# Patient Record
Sex: Female | Born: 1960 | Race: Black or African American | Hispanic: No | Smoking: Current some day smoker
Health system: Southern US, Community
[De-identification: ages and names within clinical notes are randomized; demographics above are authoritative.]

## PROBLEM LIST (undated history)

## (undated) DIAGNOSIS — Z8739 Personal history of other diseases of the musculoskeletal system and connective tissue: Secondary | ICD-10-CM

## (undated) DIAGNOSIS — E039 Hypothyroidism, unspecified: Secondary | ICD-10-CM

## (undated) DIAGNOSIS — N200 Calculus of kidney: Secondary | ICD-10-CM

## (undated) DIAGNOSIS — F329 Major depressive disorder, single episode, unspecified: Secondary | ICD-10-CM

## (undated) DIAGNOSIS — I499 Cardiac arrhythmia, unspecified: Secondary | ICD-10-CM

## (undated) DIAGNOSIS — R011 Cardiac murmur, unspecified: Secondary | ICD-10-CM

## (undated) DIAGNOSIS — J189 Pneumonia, unspecified organism: Secondary | ICD-10-CM

## (undated) DIAGNOSIS — R000 Tachycardia, unspecified: Secondary | ICD-10-CM

## (undated) DIAGNOSIS — R51 Headache: Secondary | ICD-10-CM

## (undated) DIAGNOSIS — G629 Polyneuropathy, unspecified: Secondary | ICD-10-CM

## (undated) DIAGNOSIS — G43909 Migraine, unspecified, not intractable, without status migrainosus: Secondary | ICD-10-CM

## (undated) DIAGNOSIS — E669 Obesity, unspecified: Secondary | ICD-10-CM

## (undated) DIAGNOSIS — I10 Essential (primary) hypertension: Secondary | ICD-10-CM

## (undated) DIAGNOSIS — O159 Eclampsia, unspecified as to time period: Secondary | ICD-10-CM

## (undated) DIAGNOSIS — F32A Depression, unspecified: Secondary | ICD-10-CM

## (undated) DIAGNOSIS — R519 Headache, unspecified: Secondary | ICD-10-CM

## (undated) DIAGNOSIS — T7840XA Allergy, unspecified, initial encounter: Secondary | ICD-10-CM

## (undated) DIAGNOSIS — L732 Hidradenitis suppurativa: Secondary | ICD-10-CM

## (undated) HISTORY — PX: TUBAL LIGATION: SHX77

## (undated) HISTORY — DX: Allergy, unspecified, initial encounter: T78.40XA

## (undated) HISTORY — DX: Hidradenitis suppurativa: L73.2

---
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
## 1986-04-02 DIAGNOSIS — R011 Cardiac murmur, unspecified: Secondary | ICD-10-CM

## 1986-04-02 DIAGNOSIS — O159 Eclampsia, unspecified as to time period: Secondary | ICD-10-CM

## 1986-04-02 HISTORY — DX: Cardiac murmur, unspecified: R01.1

## 1986-04-02 HISTORY — DX: Eclampsia, unspecified as to time period: O15.9

## 2002-04-02 HISTORY — PX: DENTAL SURGERY: SHX609

## 2007-09-29 ENCOUNTER — Emergency Department (HOSPITAL_COMMUNITY): Admission: EM | Admit: 2007-09-29 | Discharge: 2007-09-30 | Payer: Self-pay | Admitting: Emergency Medicine

## 2012-07-02 ENCOUNTER — Encounter (HOSPITAL_COMMUNITY): Payer: Self-pay | Admitting: *Deleted

## 2012-07-02 ENCOUNTER — Observation Stay (HOSPITAL_COMMUNITY)
Admission: EM | Admit: 2012-07-02 | Discharge: 2012-07-03 | Disposition: A | Discharge status: Home / Self Care | Payer: MEDICAID | Attending: Internal Medicine | Admitting: Internal Medicine

## 2012-07-02 DIAGNOSIS — I10 Essential (primary) hypertension: Secondary | ICD-10-CM

## 2012-07-02 DIAGNOSIS — N390 Urinary tract infection, site not specified: Secondary | ICD-10-CM | POA: Diagnosis present

## 2012-07-02 DIAGNOSIS — Z8739 Personal history of other diseases of the musculoskeletal system and connective tissue: Secondary | ICD-10-CM

## 2012-07-02 DIAGNOSIS — R300 Dysuria: Secondary | ICD-10-CM | POA: Insufficient documentation

## 2012-07-02 DIAGNOSIS — F17200 Nicotine dependence, unspecified, uncomplicated: Secondary | ICD-10-CM

## 2012-07-02 DIAGNOSIS — IMO0001 Reserved for inherently not codable concepts without codable children: Principal | ICD-10-CM | POA: Insufficient documentation

## 2012-07-02 DIAGNOSIS — E119 Type 2 diabetes mellitus without complications: Secondary | ICD-10-CM

## 2012-07-02 DIAGNOSIS — Z6841 Body Mass Index (BMI) 40.0 and over, adult: Secondary | ICD-10-CM | POA: Insufficient documentation

## 2012-07-02 DIAGNOSIS — R739 Hyperglycemia, unspecified: Secondary | ICD-10-CM

## 2012-07-02 DIAGNOSIS — Z720 Tobacco use: Secondary | ICD-10-CM | POA: Diagnosis present

## 2012-07-02 DIAGNOSIS — R7309 Other abnormal glucose: Secondary | ICD-10-CM

## 2012-07-02 DIAGNOSIS — E039 Hypothyroidism, unspecified: Secondary | ICD-10-CM

## 2012-07-02 DIAGNOSIS — E1142 Type 2 diabetes mellitus with diabetic polyneuropathy: Secondary | ICD-10-CM | POA: Diagnosis present

## 2012-07-02 HISTORY — DX: Headache, unspecified: R51.9

## 2012-07-02 HISTORY — DX: Tachycardia, unspecified: R00.0

## 2012-07-02 HISTORY — DX: Headache: R51

## 2012-07-02 HISTORY — DX: Calculus of kidney: N20.0

## 2012-07-02 HISTORY — DX: Cardiac arrhythmia, unspecified: I49.9

## 2012-07-02 HISTORY — DX: Cardiac murmur, unspecified: R01.1

## 2012-07-02 HISTORY — DX: Major depressive disorder, single episode, unspecified: F32.9

## 2012-07-02 HISTORY — DX: Obesity, unspecified: E66.9

## 2012-07-02 HISTORY — DX: Pneumonia, unspecified organism: J18.9

## 2012-07-02 HISTORY — DX: Depression, unspecified: F32.A

## 2012-07-02 HISTORY — DX: Migraine, unspecified, not intractable, without status migrainosus: G43.909

## 2012-07-02 HISTORY — DX: Personal history of other diseases of the musculoskeletal system and connective tissue: Z87.39

## 2012-07-02 HISTORY — DX: Polyneuropathy, unspecified: G62.9

## 2012-07-02 HISTORY — DX: Eclampsia, unspecified as to time period: O15.9

## 2012-07-02 HISTORY — DX: Essential (primary) hypertension: I10

## 2012-07-02 HISTORY — DX: Hypothyroidism, unspecified: E03.9

## 2012-07-02 LAB — CBC
Hemoglobin: 14.8 g/dL (ref 12.0–15.0)
Hemoglobin: 14.2 g/dL (ref 12.0–15.0)
MCH: 30 pg (ref 26.0–34.0)
MCH: 30 pg (ref 26.0–34.0)
MCV: 84.4 fL (ref 78.0–100.0)
Platelets: 333 10*3/uL (ref 150–400)
RBC: 4.94 MIL/uL (ref 3.87–5.11)
RBC: 4.73 MIL/uL (ref 3.87–5.11)
WBC: 5.1 10*3/uL (ref 4.0–10.5)
WBC: 5.8 10*3/uL (ref 4.0–10.5)

## 2012-07-02 LAB — URINALYSIS, ROUTINE W REFLEX MICROSCOPIC
Glucose, UA: >1000 mg/dL — AB
pH: 6.5 (ref 5.0–8.0)

## 2012-07-02 LAB — POCT PREGNANCY, URINE: Preg Test, Ur: NEGATIVE

## 2012-07-02 LAB — BASIC METABOLIC PANEL
Calcium: 9.2 mg/dL (ref 8.4–10.5)
Creatinine, Ser: 0.55 mg/dL (ref 0.50–1.10)
GFR calc Af Amer: >90 mL/min (ref >90)

## 2012-07-02 LAB — COMPREHENSIVE METABOLIC PANEL
ALT: 12 U/L (ref 0–35)
AST: 11 U/L (ref 0–37)
Alkaline Phosphatase: 101 U/L (ref 39–117)
CO2: 26 mEq/L (ref 19–32)
Calcium: 9.6 mg/dL (ref 8.4–10.5)
Chloride: 92 mEq/L — ABNORMAL LOW (ref 96–112)
Glucose, Bld: 505 mg/dL — ABNORMAL HIGH (ref 70–99)
Sodium: 131 mEq/L — ABNORMAL LOW (ref 135–145)
Total Bilirubin: 0.2 mg/dL — ABNORMAL LOW (ref 0.3–1.2)

## 2012-07-02 LAB — GLUCOSE, CAPILLARY: Glucose-Capillary: 522 mg/dL — ABNORMAL HIGH (ref 70–99)

## 2012-07-02 LAB — RAPID URINE DRUG SCREEN, HOSP PERFORMED
Barbiturates: NOT DETECTED
Cocaine: NOT DETECTED

## 2012-07-02 LAB — URINE MICROSCOPIC-ADD ON

## 2012-07-02 LAB — CREATININE, SERUM: GFR calc non Af Amer: >90 mL/min (ref >90)

## 2012-07-02 MED ORDER — INSULIN ASPART 100 UNIT/ML ~~LOC~~ SOLN
4.0000 [IU] | Freq: Three times a day (TID) | SUBCUTANEOUS | Status: DC
  Administered 2012-07-03 (×2): 4 [IU] via SUBCUTANEOUS

## 2012-07-02 MED ORDER — INSULIN GLARGINE 100 UNIT/ML ~~LOC~~ SOLN
10.0000 [IU] | Freq: Every day | SUBCUTANEOUS | Status: DC
  Administered 2012-07-02: 10 [IU] via SUBCUTANEOUS
  Filled 2012-07-02 (×3): qty 0.1

## 2012-07-02 MED ORDER — SODIUM CHLORIDE 0.9 % IV SOLN: INTRAVENOUS | Status: DC

## 2012-07-02 MED ORDER — CIPROFLOXACIN HCL 500 MG PO TABS
500.0000 mg | ORAL_TABLET | Freq: Two times a day (BID) | ORAL | Status: DC
  Administered 2012-07-02 – 2012-07-03 (×2): 500 mg via ORAL
  Filled 2012-07-02 (×4): qty 1

## 2012-07-02 MED ORDER — SODIUM CHLORIDE 0.9 % IV SOLN
INTRAVENOUS | Status: DC
  Administered 2012-07-02: 125 mL/h via INTRAVENOUS

## 2012-07-02 MED ORDER — SODIUM CHLORIDE 0.9 % IV SOLN
INTRAVENOUS | Status: AC
  Administered 2012-07-03: 04:00:00 via INTRAVENOUS

## 2012-07-02 MED ORDER — SODIUM CHLORIDE 0.9 % IV SOLN: Freq: Once | INTRAVENOUS | Status: DC

## 2012-07-02 MED ORDER — SODIUM CHLORIDE 0.9 % IV BOLUS (SEPSIS)
1000.0000 mL | Freq: Once | INTRAVENOUS | Status: AC
  Administered 2012-07-02 (×2): 1000 mL via INTRAVENOUS

## 2012-07-02 MED ORDER — ENOXAPARIN SODIUM 40 MG/0.4ML ~~LOC~~ SOLN
40.0000 mg | SUBCUTANEOUS | Status: DC
  Administered 2012-07-02: 40 mg via SUBCUTANEOUS
  Filled 2012-07-02 (×2): qty 0.4

## 2012-07-02 MED ORDER — INSULIN ASPART 100 UNIT/ML ~~LOC~~ SOLN
0.0000 [IU] | Freq: Three times a day (TID) | SUBCUTANEOUS | Status: DC
  Administered 2012-07-03 (×2): 5 [IU] via SUBCUTANEOUS

## 2012-07-02 MED ORDER — SODIUM CHLORIDE 0.9 % IV SOLN
INTRAVENOUS | Status: DC
  Administered 2012-07-02: 4.6 [IU]/h via INTRAVENOUS
  Filled 2012-07-02: qty 1

## 2012-07-02 NOTE — Progress Notes (Addendum)
Pt admitted to the unit. Pt is alert and oriented. Pt oriented to room, staff, and call bell. Bed in lowest position. Full assessment to Epic. Call bell with in reach. Told to call for assists. Will continue to monitor. Page to teaching service for notification of patients arrival to the floor. Awaiting call back.Burley Saver, RN

## 2012-07-02 NOTE — ED Notes (Signed)
CBG was 522. Notified Nurse Johnston Ebbs.

## 2012-07-02 NOTE — ED Provider Notes (Signed)
History     CSN: [id number]  Arrival date & time 07/02/12  1705   First MD Initiated Contact with Patient 07/02/12 1721      Chief Complaint  Patient presents with  . Hyperglycemia    (Consider location/radiation/quality/duration/timing/severity/associated sxs/prior treatment) HPI Pt with no known history of DM has been having symptoms of increased thirst and urination for the last 3 months. She has been checking her blood glucose at home during that time with readings in the 200s initially, improved with drinking water and walking but blood glucose has gradually worsened until the last few days when it has been in the 500s. She denies any nausea, vomiting or fevers. Reports some burning with urination.   Past Medical History  Diagnosis Date  . Obesity   . Hypertension   . Hypothyroid     History reviewed. No pertinent past surgical history.  History reviewed. No pertinent family history.  History  Substance Use Topics  . Smoking status: Current Every Day Smoker    Types: Cigarettes  . Smokeless tobacco: Not on file  . Alcohol Use: No    OB History   Grav Para Term Preterm Abortions TAB SAB Ect Mult Living                  Review of Systems All other systems reviewed and are negative except as noted in HPI.   Allergies  Aspirin and Tetracyclines & related  Home Medications   Current Outpatient Rx  Name  Route  Sig  Dispense  Refill  . Multiple Vitamin (MULTIVITAMIN WITH MINERALS) TABS   Oral   Take 1 tablet by mouth daily.           BP 150/73  Pulse 113  Temp(Src) 98.9 F (37.2 C) (Oral)  Resp 18  Ht 5\' 3"  (1.6 m)  Wt 245 lb (111.131 kg)  BMI 43.41 kg/m2  SpO2 94%  Physical Exam  Nursing note and vitals reviewed. Constitutional: She is oriented to person, place, and time. She appears well-developed and well-nourished.  HENT:  Head: Normocephalic and atraumatic.  Eyes: EOM are normal. Pupils are equal, round, and reactive to light.  Neck:  Normal range of motion. Neck supple.  Cardiovascular: Normal rate, normal heart sounds and intact distal pulses.   Pulmonary/Chest: Effort normal and breath sounds normal.  Abdominal: Bowel sounds are normal. She exhibits no distension. There is no tenderness.  Musculoskeletal: Normal range of motion. She exhibits no edema and no tenderness.  Neurological: She is alert and oriented to person, place, and time. She has normal strength. No cranial nerve deficit or sensory deficit.  Skin: Skin is warm and dry. No rash noted.  Psychiatric: She has a normal mood and affect.    ED Course  Procedures (including critical care time)  Labs Reviewed  COMPREHENSIVE METABOLIC PANEL - Abnormal; Notable for the following:    Sodium 131 (*)    Chloride 92 (*)    Glucose, Bld 505 (*)    BUN 4 (*)    Albumin 3.3 (*)    Total Bilirubin 0.2 (*)    All other components within normal limits  GLUCOSE, CAPILLARY - Abnormal; Notable for the following:    Glucose-Capillary 522 (*)    All other components within normal limits  CBC  URINALYSIS, ROUTINE W REFLEX MICROSCOPIC  BASIC METABOLIC PANEL  POCT PREGNANCY, URINE   No results found.   1. Hyperglycemia       MDM  Pt with  hyperglycemia, no significant acidosis. Discussed with [facility] resident who asks that we d/c the insulin drip so she can go to the floor. Continue with IVF for now, she will begin sliding scale with admission ordered.         Charles B. Bernette Mayers, MD 07/02/12 [phone]

## 2012-07-02 NOTE — ED Notes (Signed)
Pt reports cbg 588 pta, does not take any DM meds. Reports having excessive thirst, frequent urination, and dizziness.

## 2012-07-02 NOTE — ED Notes (Signed)
Pt states she has been checking her sugars for 3 months per her MD, but has not been put on any medications.  Has been c/o dizziness, excessive thirst x 3 months, but came in today b/c her sugars were elevated on her monitor.  Denies abd pain or nausea.

## 2012-07-02 NOTE — H&P (Signed)
Hospital Admission Note Date: 07/03/2012  Patient name: Melanie Henry Medical record number: [id number] Date of birth: Jan 19, 1961 Age: 52 y.o. Gender: female PCP: none  Medical Service: Internal Medicine Attending physician: Dr. Kem Kays    1st Contact: Dr. Heloise Beecham Pager: [phone] 2nd Contact: Dr. Manson Passey Pager: [phone] After 5 pm or weekends: 1st Contact: Pager: [phone] 2nd Contact: Pager: [phone]  Chief Complaint: Increased thirst, urination, hyperglycemia  History of Present Illness: 52 y.o with no known history of diabetes.  10 years ago she states her HA1C was checked and it was 4.6%.  She has been having symptoms over the last 3-6 months of increased thirst, increased urinary frequency, blurry vision, worsening x 2-3 weeks.  She also had h/a and feeling off balance.  She has been checking her blood glucose at home during that time with readings in the mid 200s initially, then 350s (fasting in the am), then glucose levels in 450s-500s fasting.  She checked her finger stick on day of admission and her blood sugar was 588 fasting so she decided to come to the Emergency Room.  To improve her sugars at home she has been trying to drink water, exercise, and watching her carbohydrate intake. She has not followed a PCP in 10-15 years. ROS +(blurry vision,  consistency of urine is lighter, thicker, sticky, +chronic lower extremity edema, +dizziness, resolved h/a, elevated blood sugars, increased thirst, increased urinary frequency  She also has a history thyroid problems.  She has not been taking Synthroid for 7-8 years.   She has been hypertensive at home checking her BP with elevated 180/>100.  She has previously tried HCTZ, Lasix and a blood pressure pill starting with a "P"  Meds: Medications Prior to Admission  Medication Sig Dispense Refill  . Multiple Vitamin (MULTIVITAMIN WITH MINERALS) TABS Take 1 tablet by mouth daily.      Tylenol as needed  Lasix as needed at home.  Last taken 2 weeks  ago  Allergies: Allergies as of 07/02/2012 - Review Complete 07/02/2012  Allergen Reaction Noted  . Tetracyclines & related Anaphylaxis, Hives, and Nausea And Vomiting 07/02/2012  . Aspirin  07/02/2012   Past Medical History  Diagnosis Date  . Obesity   . Hypertension   . Hypothyroid   . Eclampsia 1988    stroke like symtoms with pregnancy, h/o protenuria with eclampsia   . Peripheral neuropathy     As of 07/2012 patient reports she was dx'ed 25 yrs ago previously taking Neurontin and B12 shot   . Tachycardia   . Dysrhythmia   . Pneumonia ~ 1991    "once" (07/02/2012)  . Migraines     "occasionally" (07/02/2012)  . Daily headache     "for the past 2-3 wks" (07/02/2012)  . Heart murmur 1988    "I had eclampsia; they said I had a stroke" (07/02/2012)  . 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 Depression   . Kidney stones     "once" (07/02/2012)   Past Surgical History:  Cesarean Sectionx 2  Tubal Ligation Tonsillectomy  Orthopedic surgery (right thumb, ankle)  Family History:  Paternal uncle with diabetes   History   Social History  . Marital Status: Divorced    Spouse Name: N/A    Number of Children: N/A  . Years of Education: N/A   Occupational History  . Not on file.   Social History Main Topics  . Smoking status: Current Every Day  Smoker -- 0.25 packs/day for 30 years    Types: Cigarettes  . Smokeless tobacco: Never Used     Comment: 07/02/2012 offered smoking cessation materials; pt declines  . Alcohol Use: No  . Drug Use: No  . Sexually Active: No   Other Topics Concern  . Not on file   Social History Narrative   2 kids   Smokes 4-5 cigarettes daily.  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 rate 16, height 5\' 3"  (1.6 m), weight 251 lb 9.6 oz (114.125 kg), SpO2 99.00%. General: sitting on side of bed, nad, alert and oriented x 3, father at bedside, obese  HEENT: perrla b/l, very poor dentition, wet oral mucosa CV: RRR, no rubs, murmurs or gallops Lungs: ctab Ab: soft, ntnd, normal bs, obese abdomen Extremities: 1+edema b/l, warm no cyanosis of edema, clavi to feet  Neuro: CN 2-12 grossly intact, moving all 4 extremities, alert and oriented x 3   Lab results: Basic Metabolic Panel:  Recent Labs  16/10/96 1726 07/02/12 1835 07/02/12 2157  NA 131* 134*  --   K 3.7 3.5  --   CL 92* 98  --   CO2 26 27  --   GLUCOSE 505* 396*  --   BUN 4* 4*  --   CREATININE 0.57 0.55 0.52  CALCIUM 9.6 9.2  --    Liver Function Tests:  Recent Labs  07/02/12 1726  AST 11  ALT 12  ALKPHOS 101  BILITOT 0.2*  PROT 8.0  ALBUMIN 3.3*   CBC:  Recent Labs  07/02/12 1726 07/02/12 2157  WBC 5.1 5.8  HGB 14.8 14.2  HCT 41.5 39.9  MCV 84.0 84.4  PLT 333 334   CBG:  Recent Labs  07/02/12 1717 07/02/12 2019 07/02/12 2032  GLUCAP 522* 261* 295*   Hemoglobin A1C:  Recent Labs  07/02/12 2001  HGBA1C 13.9*    Results for KRISTOL, ALMANZAR (MRN [id number]) as of 07/03/2012 02:55  Ref. Range 07/02/2012 20:01  TSH Latest Range: 0.350-4.500 uIU/mL 1.478   Urinalysis:  Recent Labs  07/02/12 1803  COLORURINE YELLOW  LABSPEC 1.020  PHURINE 6.5  GLUCOSEU >1000*  HGBUR TRACE*  BILIRUBINUR NEGATIVE  KETONESUR NEGATIVE  PROTEINUR NEGATIVE  UROBILINOGEN 0.2  NITRITE NEGATIVE  LEUKOCYTESUR SMALL*   Misc.  Labs: Urine culture pending    Imaging results:  None  Other results: EKG: none  Assessment & Plan by Problem: 52 y.o PMH obesity, HTN, hypothyroidism.  She presents for hyperglycemia with glucose 522 in the Emergency room, polydipsia, polyruria.   1. Hyperglycemia with concern for new onset diabetes (HA1C 13.9%)  -Acute hyperglycemia, no significant acidosis or evidence of DKA with initial anion gap 13 and repeat BMET 9. UA suspcious for UTI.  Infection can trigger hyperglycemia in underlying diabetes.  Her diabetes appears to be new onset as she has not regularly followed with a PCP and no previous diagnosis of diabetes.  She also has poor dentition.   -She was initially on insulin drip in the ED which was titrated off.  She was bolused 1L.  She was on NS 125 cc/hr  -will start SSI, monitor cbg.  Novolog 4 units tid with meals, Lantus 10 units  -pending urine culture   -will order diabetes education this admission  -will have patient establish care with Internal Medicine Clinic outpatient (She will need to follow with Lynnae January and Gavin Pound for diabetes education and nutrition, diet modifications) -will need foot care/monitoring in the future  -She will need to establish care with dentistry -will start Cipro 500 mg bid x 7 days for pyuria suggestive possible UTI  2. HTN history  -Will monitor VS  -She was not taking medications prior to admission and blood pressure was uncontrolled at home per patient 180s/120s.  BP on admission was 150/73.   -Consider antihypertensive (i.e Lisinopril in setting diabetes or HCTZ)   3. Pyuria suggestive of possible Urinary tract infection -Although patient denied burning with urination per review of records in the ED she endorsed dysuria.   -will treat with Cipro 500 mg bid x 7 days -pending urine culture to follow   4. Hypothyroidism history -normal tsh.  patient has been noncompliant for 7-8 years with Synthroid   5. Tobacco abuse   -ordered smoking cessation   6. Obesity  -encourage weight loss -may need nutrition counseling in the future   7. F/E/N -NS 125 cc/hr  -will monitor and replace electrolytes prn  -carb modified diet   8. DVT px  -Lovenox    Dispo:  Will likely d/c tomorrow with outpatient follow up with Internal Medicine.   The patient does not have a current PCP therefore will be requiring OPC follow-up after discharge.   The patient does not have transportation limitations that hinder transportation to clinic appointments.  SignedAnnett Gula [phone] 07/03/2012, 2:56 AM

## 2012-07-03 DIAGNOSIS — Z720 Tobacco use: Secondary | ICD-10-CM | POA: Diagnosis present

## 2012-07-03 DIAGNOSIS — N390 Urinary tract infection, site not specified: Secondary | ICD-10-CM

## 2012-07-03 DIAGNOSIS — E119 Type 2 diabetes mellitus without complications: Secondary | ICD-10-CM

## 2012-07-03 DIAGNOSIS — E1142 Type 2 diabetes mellitus with diabetic polyneuropathy: Secondary | ICD-10-CM | POA: Diagnosis present

## 2012-07-03 LAB — CBC
HCT: 39.6 % (ref 36.0–46.0)
MCH: 28.8 pg (ref 26.0–34.0)
MCHC: 34.6 g/dL (ref 30.0–36.0)
MCV: 83.2 fL (ref 78.0–100.0)
RDW: 12.8 % (ref 11.5–15.5)

## 2012-07-03 LAB — BASIC METABOLIC PANEL
BUN: 4 mg/dL — ABNORMAL LOW (ref 6–23)
Chloride: 102 mEq/L (ref 96–112)
Creatinine, Ser: 0.55 mg/dL (ref 0.50–1.10)
GFR calc Af Amer: >90 mL/min (ref >90)

## 2012-07-03 LAB — HEMOGLOBIN A1C: Hgb A1c MFr Bld: 13.9 % — ABNORMAL HIGH (ref <5.7)

## 2012-07-03 LAB — GLUCOSE, CAPILLARY
Glucose-Capillary: 266 mg/dL — ABNORMAL HIGH (ref 70–99)
Glucose-Capillary: 213 mg/dL — ABNORMAL HIGH (ref 70–99)
Glucose-Capillary: 229 mg/dL — ABNORMAL HIGH (ref 70–99)

## 2012-07-03 MED ORDER — "INSULIN SYRINGE-NEEDLE U-100 31G X 5/16"" 0.3 ML MISC": Status: DC

## 2012-07-03 MED ORDER — LIVING WELL WITH DIABETES BOOK
Freq: Once | Status: DC
  Filled 2012-07-03: qty 1

## 2012-07-03 MED ORDER — INSULIN NPH ISOPHANE & REGULAR (70-30) 100 UNIT/ML ~~LOC~~ SUSP: 15.0000 [IU] | Freq: Two times a day (BID) | SUBCUTANEOUS | Status: DC

## 2012-07-03 MED ORDER — "BD GETTING STARTED TAKE HOME KIT: 1ML X 30 G SYRINGES, ": 1.0000 | Freq: Once | Status: DC

## 2012-07-03 MED ORDER — CIPROFLOXACIN HCL 500 MG PO TABS: 500.0000 mg | ORAL_TABLET | Freq: Two times a day (BID) | ORAL | Status: DC

## 2012-07-03 MED ORDER — BD GETTING STARTED TAKE HOME KIT: 3/10ML X 30G SYRINGES
1.0000 | Freq: Once | Status: DC
  Filled 2012-07-03: qty 1

## 2012-07-03 NOTE — Plan of Care (Signed)
Problem: Consults Goal: Diagnosis-Diabetes Mellitus New Onset Type II      

## 2012-07-03 NOTE — Discharge Summary (Signed)
Internal Medicine Teaching [facility] Discharge Note  Name: Melanie Henry MRN: [id number] DOB: [birth year] 52 y.o.  Date of Admission: 07/02/2012  5:18 PM Date of Discharge: 07/03/2012 Attending Physician: Jonah Blue, DO  Discharge Diagnosis: Principal Problem:   Type II or unspecified type diabetes mellitus without mention of complication, uncontrolled Active Problems:   Hyperglycemia   Morbid obesity   Complicated urinary tract infection   Tobacco abuse   Discharge Medications:   Medication List    TAKE these medications       ciprofloxacin 500 MG tablet  Commonly known as:  CIPRO  Take 1 tablet (500 mg total) by mouth 2 (two) times daily.     insulin NPH-regular (70-30) 100 UNIT/ML injection  Commonly known as:  NOVOLIN 70/30  Inject 15 Units into the skin 2 (two) times daily with a meal.     Insulin Syringe-Needle U-100 31G X 5/16" 0.3 ML Misc  Use needles and syringe to inject insulin as instructed.      ASK your doctor about these medications       multivitamin with minerals Tabs  Take 1 tablet by mouth daily.        Disposition and follow-up:   Melanie Henry was discharged from [facility] in Good condition.  At the hospital follow up visit please address  1) New type 2 diabetes Patient was started on Relion 70/30 15 units twice a day to to lack of insurance status and inability to afford Lantus. She will likely need up titration of insulin at her outpatient followup appointment and will be bringing in blood glucometer to guide this effort. She will need to with [facility]. She will need an ophthalmology referral or retinal scanner during her appointment. She will need a foot exam. She will need urine microalbumin to creatinine ratio. She will need lipids checked. 2) Urinary tract infection, complicated She was discharged on Cipro 500 mg twice a day. Please evaluate for clinical resolution of symptoms. 3) Tobacco abuse Continue to  emphasize cessation, provide clinical resources. 4) Orange card Please send her to [employer] to discuss orange card.   Follow-up Appointments: Follow-up Information   Follow up with Genelle Gather, MD On 07/08/2012. (9:45 am)    Contact information:   [address] [place] [address] [phone]       Follow up with Plyler, Lupita Leash, RD On 07/08/2012. (8:30 am)    Contact information:   [address] [place] [address] [phone]      Discharge Orders   Future Appointments Provider Department Dept Phone   07/08/2012 8:30 AM Cecil Cranker Plyler, RD [place] [facility] [phone]   07/08/2012 9:45 AM Genelle Gather, MD [facility] [phone]   Future Orders Complete By Expires     Call MD for:  difficulty breathing, headache or visual disturbances  As directed     Call MD for:  extreme fatigue  As directed     Call MD for:  persistant dizziness or light-headedness  As directed     Call MD for:  persistant nausea and vomiting  As directed     Diet - low sodium heart healthy  As directed        Admission HPI:  52 y.o with no known history of diabetes. 10 years ago she states her HA1C was checked and it was 4.6%. She has been having symptoms over the last 3-6 months of  increased thirst, increased urinary frequency, blurry vision, worsening x 2-3 weeks. She also had h/a and feeling off balance. She has been checking her blood glucose at home during that time with readings in the mid 200s initially, then 350s (fasting in the am), then glucose levels in 450s-500s fasting. She checked her finger stick on day of admission and her blood sugar was 588 fasting so she decided to come to the Emergency Room. To improve her sugars at home she has been trying to drink water, exercise, and watching her carbohydrate intake. She has not followed a PCP in 10-15 years. ROS +(blurry vision, consistency of urine is lighter, thicker, sticky,  +chronic lower extremity edema, +dizziness, resolved h/a, elevated blood sugars, increased thirst, increased urinary frequency  She also has a history thyroid problems. She has not been taking Synthroid for 7-8 years.  She has been hypertensive at home checking her BP with elevated 180/>100. She has previously tried HCTZ, Lasix and a blood pressure pill starting with a "P"   Hospital Course by problem list:  1) Type II or unspecified type diabetes mellitus without mention of complication, uncontrolled Patient presented with acute hyperglycemia w BG in 500s, no significant acidosis or evidence of DKA with initial anion gap 13 and repeat 9. Hemoglobin A1c 13.9. UA suspcious for UTI. Infection likely precipitated worsening hyperglycemia in underlying diabetes. This is a new diagnosis for her, as she has not followed with a PCP. She was initially on insulin drip in the ED which was titrated off. She was bolused 1L and given maintenance fluids of NS 125 cc/hr. she was given 10 units of Lantus, NovoLog 4 units with meals, and sliding scale insulin. Blood glucose in 200s the morning of discharge. No hypoglycemia. She tolerated po intake well with no nausea or vomiting. She met with a diabetes educator prior to discharge. 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 She will be discharged on Novolin 70/30 15 units twice a day, to be titrated up on outpatient followup. She will followup in the internal medicine clinic next week and also meet with our diabetes educator, Norm Parcel. She will need referral for ophthalmology or retinal scanning in the clinic. She will need a diabetic foot exam. She will need basic health maintenance including checking a lipid panel.  2) Urinary tract infection, complicated Patient complained of dysuria and has 21-50 white blood cells in her urine. Culture was sent and is pending at time of  discharge. Will treat with ciprofloxacin 500 mg bid x 7 days for complicated UTI in the setting of newly diagnosed type 2 diabetes.   3) Hypothyroidism She has a history of hypothyroidism not currently on medication. Her TSH was normal.  4) History of hypertension She reports a history of hypertension that was mostly normotensive during this admission and is not on any antihypertensive therapy. This went to be followed as an outpatient. No new medicines started.  5) Tobacco abuse Counseled smoking cessation.  6) Morbid obesity Made an appointment for her to meet with our dietitian and diabetes educator on hospital follow up.   Discharge Vitals:  BP 110/64  Pulse 87  Temp(Src) 98.2 F (36.8 C) (Oral)  Resp 20  Ht 5\' 3"  (1.6 m)  Wt 114.125 kg (251 lb 9.6 oz)  BMI 44.58 kg/m2  SpO2 100%  Discharge Labs:  Results for orders placed during the hospital  encounter of 07/02/12 (from the past 24 hour(s))  GLUCOSE, CAPILLARY     Status: Abnormal   Collection Time    07/02/12  5:17 PM      Result Value Range   Glucose-Capillary 522 (*) 70 - 99 mg/dL   Comment 1 Documented in Chart     Comment 2 Notify RN    CBC     Status: None   Collection Time    07/02/12  5:26 PM      Result Value Range   WBC 5.1  4.0 - 10.5 K/uL   RBC 4.94  3.87 - 5.11 MIL/uL   Hemoglobin 14.8  12.0 - 15.0 g/dL   HCT 40.9  81.1 - 91.4 %   MCV 84.0  78.0 - 100.0 fL   MCH 30.0  26.0 - 34.0 pg   MCHC 35.7  30.0 - 36.0 g/dL   RDW 78.2  95.6 - 21.3 %   Platelets 333  150 - 400 K/uL  COMPREHENSIVE METABOLIC PANEL     Status: Abnormal   Collection Time    07/02/12  5:26 PM      Result Value Range   Sodium 131 (*) 135 - 145 mEq/L   Potassium 3.7  3.5 - 5.1 mEq/L   Chloride 92 (*) 96 - 112 mEq/L   CO2 26  19 - 32 mEq/L   Glucose, Bld 505 (*) 70 - 99 mg/dL   BUN 4 (*) 6 - 23 mg/dL   Creatinine, Ser 0.86  0.50 - 1.10 mg/dL   Calcium 9.6  8.4 - 57.8 mg/dL   Total Protein 8.0  6.0 - 8.3 g/dL   Albumin 3.3 (*)  3.5 - 5.2 g/dL   AST 11  0 - 37 U/L   ALT 12  0 - 35 U/L   Alkaline Phosphatase 101  39 - 117 U/L   Total Bilirubin 0.2 (*) 0.3 - 1.2 mg/dL   GFR calc non Af Amer >90  >90 mL/min   GFR calc Af Amer >90  >90 mL/min  URINE RAPID DRUG SCREEN (HOSP PERFORMED)     Status: None   Collection Time    07/02/12  6:00 PM      Result Value Range   Opiates NONE DETECTED  NONE DETECTED   Cocaine NONE DETECTED  NONE DETECTED   Benzodiazepines NONE DETECTED  NONE DETECTED   Amphetamines NONE DETECTED  NONE DETECTED   Tetrahydrocannabinol NONE DETECTED  NONE DETECTED   Barbiturates NONE DETECTED  NONE DETECTED  URINALYSIS, ROUTINE W REFLEX MICROSCOPIC     Status: Abnormal   Collection Time    07/02/12  6:03 PM      Result Value Range   Color, Urine YELLOW  YELLOW   APPearance HAZY (*) CLEAR   Specific Gravity, Urine 1.020  1.005 - 1.030   pH 6.5  5.0 - 8.0   Glucose, UA >1000 (*) NEGATIVE mg/dL   Hgb urine dipstick TRACE (*) NEGATIVE   Bilirubin Urine NEGATIVE  NEGATIVE   Ketones, ur NEGATIVE  NEGATIVE mg/dL   Protein, ur NEGATIVE  NEGATIVE mg/dL   Urobilinogen, UA 0.2  0.0 - 1.0 mg/dL   Nitrite NEGATIVE  NEGATIVE   Leukocytes, UA SMALL (*) NEGATIVE  URINE MICROSCOPIC-ADD ON     Status: Abnormal   Collection Time    07/02/12  6:03 PM      Result Value Range   Squamous Epithelial / LPF RARE  RARE   WBC, UA 21-50  <  3 WBC/hpf   RBC / HPF 0-2  <3 RBC/hpf   Bacteria, UA FEW (*) RARE  POCT PREGNANCY, URINE     Status: None   Collection Time    07/02/12  6:07 PM      Result Value Range   Preg Test, Ur NEGATIVE  NEGATIVE  BASIC METABOLIC PANEL     Status: Abnormal   Collection Time    07/02/12  6:35 PM      Result Value Range   Sodium 134 (*) 135 - 145 mEq/L   Potassium 3.5  3.5 - 5.1 mEq/L   Chloride 98  96 - 112 mEq/L   CO2 27  19 - 32 mEq/L   Glucose, Bld 396 (*) 70 - 99 mg/dL   BUN 4 (*) 6 - 23 mg/dL   Creatinine, Ser 1.61  0.50 - 1.10 mg/dL   Calcium 9.2  8.4 - 09.6 mg/dL   GFR  calc non Af Amer >90  >90 mL/min   GFR calc Af Amer >90  >90 mL/min  HEMOGLOBIN A1C     Status: Abnormal   Collection Time    07/02/12  8:01 PM      Result Value Range   Hemoglobin A1C 13.9 (*) <5.7 %   Mean Plasma Glucose 352 (*) <117 mg/dL  TSH     Status: None   Collection Time    07/02/12  8:01 PM      Result Value Range   TSH 1.478  0.350 - 4.500 uIU/mL  GLUCOSE, CAPILLARY     Status: Abnormal   Collection Time    07/02/12  8:19 PM      Result Value Range   Glucose-Capillary 261 (*) 70 - 99 mg/dL   Comment 1 Documented in Chart     Comment 2 Notify RN    GLUCOSE, CAPILLARY     Status: Abnormal   Collection Time    07/02/12  8:32 PM      Result Value Range   Glucose-Capillary 295 (*) 70 - 99 mg/dL   Comment 1 Documented in Chart     Comment 2 Notify RN    GLUCOSE, CAPILLARY     Status: Abnormal   Collection Time    07/02/12  8:57 PM      Result Value Range   Glucose-Capillary 266 (*) 70 - 99 mg/dL  CBC     Status: None   Collection Time    07/02/12  9:57 PM      Result Value Range   WBC 5.8  4.0 - 10.5 K/uL   RBC 4.73  3.87 - 5.11 MIL/uL   Hemoglobin 14.2  12.0 - 15.0 g/dL   HCT 04.5  40.9 - 81.1 %   MCV 84.4  78.0 - 100.0 fL   MCH 30.0  26.0 - 34.0 pg   MCHC 35.6  30.0 - 36.0 g/dL   RDW 91.4  78.2 - 95.6 %   Platelets 334  150 - 400 K/uL  CREATININE, SERUM     Status: None   Collection Time    07/02/12  9:57 PM      Result Value Range   Creatinine, Ser 0.52  0.50 - 1.10 mg/dL   GFR calc non Af Amer >90  >90 mL/min   GFR calc Af Amer >90  >90 mL/min  BASIC METABOLIC PANEL     Status: Abnormal   Collection Time    07/03/12  5:56 AM      Result  Value Range   Sodium 138  135 - 145 mEq/L   Potassium 3.7  3.5 - 5.1 mEq/L   Chloride 102  96 - 112 mEq/L   CO2 27  19 - 32 mEq/L   Glucose, Bld 235 (*) 70 - 99 mg/dL   BUN 4 (*) 6 - 23 mg/dL   Creatinine, Ser 1.61  0.50 - 1.10 mg/dL   Calcium 8.9  8.4 - 09.6 mg/dL   GFR calc non Af Amer >90  >90 mL/min   GFR  calc Af Amer >90  >90 mL/min  CBC     Status: None   Collection Time    07/03/12  5:56 AM      Result Value Range   WBC 6.0  4.0 - 10.5 K/uL   RBC 4.76  3.87 - 5.11 MIL/uL   Hemoglobin 13.7  12.0 - 15.0 g/dL   HCT 04.5  40.9 - 81.1 %   MCV 83.2  78.0 - 100.0 fL   MCH 28.8  26.0 - 34.0 pg   MCHC 34.6  30.0 - 36.0 g/dL   RDW 91.4  78.2 - 95.6 %   Platelets 325  150 - 400 K/uL  GLUCOSE, CAPILLARY     Status: Abnormal   Collection Time    07/03/12  7:34 AM      Result Value Range   Glucose-Capillary 213 (*) 70 - 99 mg/dL  GLUCOSE, CAPILLARY     Status: Abnormal   Collection Time    07/03/12 11:36 AM      Result Value Range   Glucose-Capillary 229 (*) 70 - 99 mg/dL    Signed: Bronson Curb 07/03/2012, 1:35 PM   Time Spent on Discharge: 35 min Services Ordered on Discharge: none Equipment Ordered on Discharge: none

## 2012-07-03 NOTE — Plan of Care (Signed)
Problem: Food- and Nutrition-Related Knowledge Deficit (NB-1.1) Goal: Nutrition education Formal process to instruct or train a patient/client in a skill or to impart knowledge to help patients/clients voluntarily manage or modify food choices and eating behavior to maintain or improve health. Outcome: Completed/Met Date Met:  07/03/12  RD consulted for nutrition education regarding diabetes. Pt with new dx. She has done recent research and has good knowledge of what to do, however she has issues with motivating herself to implement these issues.    Lab Results  Component Value Date    HGBA1C 13.9* 07/02/2012    RD provided "Carbohydrate Counting for People with Diabetes" handout from the Academy of [employer] and Dietetics. Discussed different food groups and their effects on blood sugar, emphasizing carbohydrate-containing foods. Provided list of carbohydrates and recommended serving sizes of common foods.  Discussed importance of controlled and consistent carbohydrate intake throughout the day. Provided examples of ways to balance meals/snacks and encouraged intake of high-fiber, whole grain complex carbohydrates. Teach back method used.  Expect fair compliance.  Body mass index is 44.58 kg/(m^2). Pt meets criteria for Obesity Class III (extreme) based on current BMI.  Current diet order is Carbohydrate Modified Medium, patient is consuming approximately 50-100% of meals at this time. Labs and medications reviewed. No further nutrition interventions warranted at this time. RD contact information provided. If additional nutrition issues arise, please re-consult RD.  Jarold Motto MS, RD, LDN Pager: [phone] After-hours pager: [phone]

## 2012-07-03 NOTE — Plan of Care (Signed)
Problem: Consults Goal: Nutrition Consult-if indicated Outcome: Completed/Met Date Met:  07/03/12 Pt has dietician and diabetes coordinator consult ordered

## 2012-07-03 NOTE — Discharge Summary (Signed)
INTERNAL MEDICINE TEACHING SERVICE Attending Note  Date: 07/03/2012  Patient name: Melanie Henry  Medical record number: [id number]  Date of birth: 10-Jul-1960    This patient has been seen and discussed with the house staff. Please see their note for complete details. I concur with their findings and plan.   Jonah Blue, DO  07/03/2012, 1:48 PM

## 2012-07-03 NOTE — Progress Notes (Signed)
Inpatient Diabetes Program Recommendations  AACE/ADA: New Consensus Statement on Inpatient Glycemic Control (2013)  Target Ranges:  Prepandial:   less than 140 mg/dL      Peak postprandial:   less than 180 mg/dL (1-2 hours)      Critically ill patients:  140 - 180 mg/dL    Patient admitted with new onset diabetes/hyperglycemia.  A1c 13.9% (07/02/12).  Spoke with pt about new diagnosis.  Discussed A1C results with her and explained what an A1C is, basic pathophysiology of DM Type 2, basic home care, importance of checking CBGs and maintaining good CBG control to prevent long-term and short-term complications.  Reviewed signs and symptoms of hyperglycemia and hypoglycemia.  RNs to provide ongoing basic DM education at bedside with this patient.  Have ordered educational booklet, insulin starter kit, and DM videos.  RD consult completed for diet education.  Explained 70/30 insulin to patient and how to take it at home.  Reminded patient that she will need to eat a meal when she takes this insulin.  Patient to follow up with Norm Parcel, CDE at the internal medicine clinic next week.  Has appt with physician in the clinic as well.  Per RN, patient has already practiced giving insulin.  Patient has also watched DM videos and is currently reading DM booklet.   Will follow. Ambrose Finland RN, MSN, CDE Diabetes Coordinator Inpatient Diabetes Program [phone]

## 2012-07-03 NOTE — H&P (Signed)
INTERNAL MEDICINE TEACHING SERVICE Attending Admission Note  Date: 07/03/2012  Patient name: Melanie Henry  Medical record number: [id number]  Date of birth: 11/30/1960    I have seen and evaluated Sammie Bench and discussed their care with the Residency Team.  51 yr. Old AAF with morbid obesity, HTN, hypothyroidism, hx eclampsia, hx depression, hx juvenile arthritis, presented with polyuria, polydipsia, blurry vision.  She states she has no known history of DM. She had access to a BG meter and has been having BG readings in the 200-500 lately.  She tried to improve this herself by drinking water, exercising, and watching her diet.  She admits she has no PCP and no insurance.  She has also previously taken HCTZ and lasix for HTN and perhaps and additional pill. She was noted to have a BG of 522, a HgbA1c of 13.9%, no ketones in urine, >1000 glucose in urine, and no significant AG. She was treated with IVF and started on an insulin gtt in the ED, which was later discontinued.  Physical Exam: Blood pressure 114/54, pulse 87, temperature 98.2 F (36.8 C), temperature source Oral, resp. rate 18, height 5\' 3"  (1.6 m), weight 251 lb 9.6 oz (114.125 kg), SpO2 98.00%.  General: Vital signs reviewed and noted. Well-developed, well-nourished, in no acute distress; alert, appropriate and cooperative throughout examination.  Head: Normocephalic, atraumatic.  Eyes: PERRL, EOMI, No signs of anemia or jaundince.  Nose: Mucous membranes moist, not inflammed, nonerythematous.  Throat: Oropharynx nonerythematous, no exudate appreciated. Poor dentition.  Neck: No deformities, masses, or tenderness noted.Supple, No carotid Bruits, no JVD.  Lungs:  Normal respiratory effort. Clear to auscultation BL without crackles or wheezes.  Heart: RRR. S1 and S2 normal without gallop, murmur, or rubs.  Abdomen:  BS normoactive. Soft, Nondistended, non-tender.  No masses or organomegaly.  Extremities: Trace to 1+ pitting edema.   Neurologic: A&O X3, CN II - XII are grossly intact. Motor strength is 5/5 in the all 4 extremities, Sensations intact to light touch, Cerebellar signs negative.  Skin: No visible rashes, scars.    Lab results: Results for orders placed during the hospital encounter of 07/02/12 (from the past 24 hour(s))  GLUCOSE, CAPILLARY     Status: Abnormal   Collection Time    07/02/12  5:17 PM      Result Value Range   Glucose-Capillary 522 (*) 70 - 99 mg/dL   Comment 1 Documented in Chart     Comment 2 Notify RN    CBC     Status: None   Collection Time    07/02/12  5:26 PM      Result Value Range   WBC 5.1  4.0 - 10.5 K/uL   RBC 4.94  3.87 - 5.11 MIL/uL   Hemoglobin 14.8  12.0 - 15.0 g/dL   HCT 40.9  81.1 - 91.4 %   MCV 84.0  78.0 - 100.0 fL   MCH 30.0  26.0 - 34.0 pg   MCHC 35.7  30.0 - 36.0 g/dL   RDW 78.2  95.6 - 21.3 %   Platelets 333  150 - 400 K/uL  COMPREHENSIVE METABOLIC PANEL     Status: Abnormal   Collection Time    07/02/12  5:26 PM      Result Value Range   Sodium 131 (*) 135 - 145 mEq/L   Potassium 3.7  3.5 - 5.1 mEq/L   Chloride 92 (*) 96 - 112 mEq/L   CO2 26  19 - 32  mEq/L   Glucose, Bld 505 (*) 70 - 99 mg/dL   BUN 4 (*) 6 - 23 mg/dL   Creatinine, Ser 4.78  0.50 - 1.10 mg/dL   Calcium 9.6  8.4 - 29.5 mg/dL   Total Protein 8.0  6.0 - 8.3 g/dL   Albumin 3.3 (*) 3.5 - 5.2 g/dL   AST 11  0 - 37 U/L   ALT 12  0 - 35 U/L   Alkaline Phosphatase 101  39 - 117 U/L   Total Bilirubin 0.2 (*) 0.3 - 1.2 mg/dL   GFR calc non Af Amer >90  >90 mL/min   GFR calc Af Amer >90  >90 mL/min  URINE RAPID DRUG SCREEN (HOSP PERFORMED)     Status: None   Collection Time    07/02/12  6:00 PM      Result Value Range   Opiates NONE DETECTED  NONE DETECTED   Cocaine NONE DETECTED  NONE DETECTED   Benzodiazepines NONE DETECTED  NONE DETECTED   Amphetamines NONE DETECTED  NONE DETECTED   Tetrahydrocannabinol NONE DETECTED  NONE DETECTED   Barbiturates NONE DETECTED  NONE DETECTED   URINALYSIS, ROUTINE W REFLEX MICROSCOPIC     Status: Abnormal   Collection Time    07/02/12  6:03 PM      Result Value Range   Color, Urine YELLOW  YELLOW   APPearance HAZY (*) CLEAR   Specific Gravity, Urine 1.020  1.005 - 1.030   pH 6.5  5.0 - 8.0   Glucose, UA >1000 (*) NEGATIVE mg/dL   Hgb urine dipstick TRACE (*) NEGATIVE   Bilirubin Urine NEGATIVE  NEGATIVE   Ketones, ur NEGATIVE  NEGATIVE mg/dL   Protein, ur NEGATIVE  NEGATIVE mg/dL   Urobilinogen, UA 0.2  0.0 - 1.0 mg/dL   Nitrite NEGATIVE  NEGATIVE   Leukocytes, UA SMALL (*) NEGATIVE  URINE MICROSCOPIC-ADD ON     Status: Abnormal   Collection Time    07/02/12  6:03 PM      Result Value Range   Squamous Epithelial / LPF RARE  RARE   WBC, UA 21-50  <3 WBC/hpf   RBC / HPF 0-2  <3 RBC/hpf   Bacteria, UA FEW (*) RARE  POCT PREGNANCY, URINE     Status: None   Collection Time    07/02/12  6:07 PM      Result Value Range   Preg Test, Ur NEGATIVE  NEGATIVE  BASIC METABOLIC PANEL     Status: Abnormal   Collection Time    07/02/12  6:35 PM      Result Value Range   Sodium 134 (*) 135 - 145 mEq/L   Potassium 3.5  3.5 - 5.1 mEq/L   Chloride 98  96 - 112 mEq/L   CO2 27  19 - 32 mEq/L   Glucose, Bld 396 (*) 70 - 99 mg/dL   BUN 4 (*) 6 - 23 mg/dL   Creatinine, Ser 6.21  0.50 - 1.10 mg/dL   Calcium 9.2  8.4 - 30.8 mg/dL   GFR calc non Af Amer >90  >90 mL/min   GFR calc Af Amer >90  >90 mL/min  HEMOGLOBIN A1C     Status: Abnormal   Collection Time    07/02/12  8:01 PM      Result Value Range   Hemoglobin A1C 13.9 (*) <5.7 %   Mean Plasma Glucose 352 (*) <117 mg/dL  TSH     Status: None  Collection Time    07/02/12  8:01 PM      Result Value Range   TSH 1.478  0.350 - 4.500 uIU/mL  GLUCOSE, CAPILLARY     Status: Abnormal   Collection Time    07/02/12  8:19 PM      Result Value Range   Glucose-Capillary 261 (*) 70 - 99 mg/dL   Comment 1 Documented in Chart     Comment 2 Notify RN    GLUCOSE, CAPILLARY     Status:  Abnormal   Collection Time    07/02/12  8:32 PM      Result Value Range   Glucose-Capillary 295 (*) 70 - 99 mg/dL   Comment 1 Documented in Chart     Comment 2 Notify RN    GLUCOSE, CAPILLARY     Status: Abnormal   Collection Time    07/02/12  8:57 PM      Result Value Range   Glucose-Capillary 266 (*) 70 - 99 mg/dL  CBC     Status: None   Collection Time    07/02/12  9:57 PM      Result Value Range   WBC 5.8  4.0 - 10.5 K/uL   RBC 4.73  3.87 - 5.11 MIL/uL   Hemoglobin 14.2  12.0 - 15.0 g/dL   HCT 95.6  21.3 - 08.6 %   MCV 84.4  78.0 - 100.0 fL   MCH 30.0  26.0 - 34.0 pg   MCHC 35.6  30.0 - 36.0 g/dL   RDW 57.8  46.9 - 62.9 %   Platelets 334  150 - 400 K/uL  CREATININE, SERUM     Status: None   Collection Time    07/02/12  9:57 PM      Result Value Range   Creatinine, Ser 0.52  0.50 - 1.10 mg/dL   GFR calc non Af Amer >90  >90 mL/min   GFR calc Af Amer >90  >90 mL/min  BASIC METABOLIC PANEL     Status: Abnormal   Collection Time    07/03/12  5:56 AM      Result Value Range   Sodium 138  135 - 145 mEq/L   Potassium 3.7  3.5 - 5.1 mEq/L   Chloride 102  96 - 112 mEq/L   CO2 27  19 - 32 mEq/L   Glucose, Bld 235 (*) 70 - 99 mg/dL   BUN 4 (*) 6 - 23 mg/dL   Creatinine, Ser 5.28  0.50 - 1.10 mg/dL   Calcium 8.9  8.4 - 41.3 mg/dL   GFR calc non Af Amer >90  >90 mL/min   GFR calc Af Amer >90  >90 mL/min  CBC     Status: None   Collection Time    07/03/12  5:56 AM      Result Value Range   WBC 6.0  4.0 - 10.5 K/uL   RBC 4.76  3.87 - 5.11 MIL/uL   Hemoglobin 13.7  12.0 - 15.0 g/dL   HCT 24.4  01.0 - 27.2 %   MCV 83.2  78.0 - 100.0 fL   MCH 28.8  26.0 - 34.0 pg   MCHC 34.6  30.0 - 36.0 g/dL   RDW 53.6  64.4 - 03.4 %   Platelets 325  150 - 400 K/uL  GLUCOSE, CAPILLARY     Status: Abnormal   Collection Time    07/03/12  7:34 AM      Result Value Range  Glucose-Capillary 213 (*) 70 - 99 mg/dL    Imaging results:  No results found.   Assessment and Plan: I  agree with the formulated Assessment and Plan with the following changes:  51 yr. Old AAF with morbid obesity, HTN, hypothyroidism, hx eclampsia, hx depression, hx juvenile arthritis, presented with polyuria, polydipsia, blurry vision, with hyperglycemia. 1) Hyperglycemia: Likely long-standing Type 2 DM. Pt is agreeable to insulin therapy. She will be prescribed Relion 70/30 which she can afford and slowly titrated up to gain better control.  She will need help applying for medicaid. She will need to follow up in our clinic to start her appropriate care, including eye exam, urine protein/creat, lipid management, nutritional counseling, diabetic counseling, etc. 2) HTN: Will need close monitoring. May need ACE-i. 3) Possible UTI: some hx of dysuria reported. Send for culture. Agree with tx with Cipro. -may be discharged today if able to set up insulin for her and follow up scheduled with [place] in next week. Jonah Blue, DO 4/3/201410:47 AM

## 2012-07-04 ENCOUNTER — Telehealth: Payer: Self-pay | Admitting: Dietician

## 2012-07-04 LAB — URINE CULTURE: Colony Count: >=100000

## 2012-07-04 NOTE — Telephone Encounter (Signed)
Calling to assist with transition of care from hospital to home. Discharge date:07-03-12 Call date: 07-04-12 Hospital follow up appointment date: 07-08-12 with doctor and Diabetes Educator

## 2012-07-08 ENCOUNTER — Encounter: Payer: Self-pay | Admitting: Internal Medicine

## 2012-07-08 ENCOUNTER — Ambulatory Visit (INDEPENDENT_AMBULATORY_CARE_PROVIDER_SITE_OTHER): Payer: Self-pay | Admitting: Dietician

## 2012-07-08 ENCOUNTER — Ambulatory Visit (INDEPENDENT_AMBULATORY_CARE_PROVIDER_SITE_OTHER): Payer: Self-pay | Admitting: Internal Medicine

## 2012-07-08 VITALS — BP 116/70 | HR 98 | Temp 98.9°F | Ht 63.0 in | Wt 258.4 lb

## 2012-07-08 DIAGNOSIS — N390 Urinary tract infection, site not specified: Secondary | ICD-10-CM

## 2012-07-08 DIAGNOSIS — Z720 Tobacco use: Secondary | ICD-10-CM

## 2012-07-08 DIAGNOSIS — R269 Unspecified abnormalities of gait and mobility: Secondary | ICD-10-CM | POA: Insufficient documentation

## 2012-07-08 DIAGNOSIS — Z0000 Encounter for general adult medical examination without abnormal findings: Secondary | ICD-10-CM | POA: Insufficient documentation

## 2012-07-08 DIAGNOSIS — F17200 Nicotine dependence, unspecified, uncomplicated: Secondary | ICD-10-CM

## 2012-07-08 NOTE — Assessment & Plan Note (Signed)
Lab Results  Component Value Date   HGBA1C 13.9* 07/02/2012     Assessment: Diabetes control: poor control (HgbA1C >9%) Progress toward A1C goal:  improved Comments:   Plan: Medications:  70/30 insulin: 17 Units in morning and 15 U in evening Home glucose monitoring:  Frequency:  3 times/day Timing:  morning, evening and night Instruction/counseling given: reminded to bring blood glucose meter & log to each visit Educational resources provided: brochure Self management tools provided:   Other plans: I discussed with diabetic educator. We decide to increased her 70/30 insuline dose to 17 Units in morning and keep her evening dose at 15 Units in evening. Re-evaluate in 2 weeks.

## 2012-07-08 NOTE — Telephone Encounter (Signed)
Patient seen in office today. 

## 2012-07-08 NOTE — Assessment & Plan Note (Signed)
  Assessment: Progress toward smoking cessation:  smoking less Barriers to progress toward smoking cessation:  lack of motivation to quit Comments:   Plan: Instruction/counseling given:  I counseled patient on the dangers of tobacco use. Educational resources provided:  [employer]) brochure Self management tools provided:    Medications to assist with smoking cessation:   Patient agreed to the following self-care plans for smoking cessation:    Other plans: patient is not ready to quit yet, but would like to consider seriously.

## 2012-07-08 NOTE — Progress Notes (Signed)
Diabetes Self-Management Education  Visit Number: First/Initial  07/08/2012 Melanie Henry, identified by name and date of birth, is a 52 y.o. female with Diabetes Type: Type 2.      ASSESSMENT  Patient Concerns:  Nutrition/Meal planning  Vitals/labs in office visit.  Lab Results: Hemoglobin A1C  Date Value Range Status  07/02/2012 13.9* <5.7 % Final     (NOTE)                                                                               Family History  Problem Relation Age of Onset  . Diabetes Paternal Uncle    History  Substance Use Topics  . Smoking status: Current Every Day Smoker -- 0.25 packs/day for 30 years    Types: Cigarettes  . Smokeless tobacco: Never Used     Comment: 07/02/2012 offered smoking cessation materials; pt declines  . 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 Has supportive friends, family and uses internet for information.    Diet Recall: likes fruit juice not fruit, usual food intake was one meal a day, but has changed to two meals since being on twice a day insulin. Will follow up to monitor progress with change of eating twice daily.   Individualized Plan for Diabetes Self-Management Training:  Patient individualized diabetes plan discussed today with patient and includes: what is Diabetes, Nutrition, medications, monitoring, how to handle highs and lows. Patient identified meal planning as her biggest interest and is not sure what she may want to discuss in the future because often the topics are related.   Education Topics Reviewed with Patient Today:  Topic Points Discussed  Disease State    Nutrition Management Role of diet in the treatment of diabetes  and the relationship between the three main macronutrients and blood glucose level;Carbohydrate counting;Meal timing in regards to the patients' current diabetes medication.  Physical Activity and Exercise    Medications Reviewed patients medication for diabetes, action, purpose, timing of dose and side effects.  Monitoring Purpose and frequency of SMBG.  Acute Complications    Chronic Complications    Psychosocial Adjustment Helped patient identify a support system for diabetes management  Goal Setting    Preconception Care (if applicable)      PATIENTS GOALS   Learning Objective(s):     Goal The patient agrees to:  Nutrition    Physical Activity    Medications take my medication as prescribed;Other (comment)- wants to work on taking insulin before meals  Monitoring    Problem Solving    Reducing Risk    Health Coping     Patient Self-Evaluation of Goals (Subsequent Visits)  Goal The patient rates self as meeting goals (% of time)  Nutrition    Physical Activity    Medications    Monitoring    Problem Solving    Reducing Risk    Health Coping  PERSONALIZED PLAN / SUPPORT  Self-Management Support:  Doctor's office;Friends;Family;Internet communities;CDE visits ______________________________________________________________________  Outcomes  Expected Outcomes:  Demonstrated interest in learning. Expect positive outcomes Self-Care Barriers:  Lack of transportation;Lack of material resources Education material provided: yes If problems or questions, patient to contact team via:  Phone and Email Time in: 1000     Time out: 1100 Future DSME appointment: - 2 wks   Plyler, Lupita Leash 07/08/2012 11:09 AM

## 2012-07-08 NOTE — Patient Instructions (Addendum)
1. Please continue to take ciprofloxacine to completed 7 day course of treatment for your urinary tract infection 2. Please increase your morning 70/30 insuline dose to 17 units, and keep your evening insulin does the same at 15 units. Please come back in 2 weeks.  3. If you have worsening of your symptoms or new symptoms arise, please call the clinic ([phone]), or go to the ER immediately if symptoms are severe.  You have done great job in taking all your medications. I appreciate it very much. Please continue doing that.

## 2012-07-08 NOTE — Patient Instructions (Addendum)
You may want to meet with [facility] Switch insulin times to before you eat.  17 units before your first  Meal (1- 2 Pm) , 15 units before your second meal 8-9 PM  Plan for diabetes training:  what is Diabetes,  Nutrition,  Medications, Monitoring, how to handle highs and lows,  Special care for my body when I have diabetes,  Dealing daily with diabetes  How insulin works with foods, timing meds with food. Meal planning- imp[ortan eof spacing meals, how that affects how our body produces and uses insulin Cheese is not a carb it is a protein  Carbs:  1- starchy veggies 2-sugars, sweet 3- starch 4-  Fruits 5- dairy  Suggest follow up in two

## 2012-07-08 NOTE — Assessment & Plan Note (Signed)
Her symptoms improved significantly. Will let patient to complete 7 day course of ciprofloxacin and followup in 2 weeks.

## 2012-07-08 NOTE — Assessment & Plan Note (Signed)
-  Will give referral to ophthalmologist for eye examination -Did foot exam.

## 2012-07-08 NOTE — Progress Notes (Signed)
Patient ID: Melanie Henry, female   DOB: 02-Sep-1960, 52 y.o.   MRN: [id number] Subjective:   Patient ID: Melanie Henry female   DOB: Nov 18, 1960 52 y.o.   MRN: [id number]  CC:  Hospital followup visit for DM-II and UTI.  HPI:  [name] is a 53 y.o. lady with past medical history as outlined below, who presents for a hospital followup visit today.  1. DM-II: Patient was recently hospitalized from 4/2-07/03/12 because of newly diagnosed diabetes. Patient was found to have A1c 13.9. Patient had significantly elevated blood sugar level on admission, but without diabetic ketoacidosis. Patient also had symptoms of hyperglycemia in the past 3-6 months, including increased thirst, increased urinary frequency, blurry vision. She was treated with IV fluid, sliding scale insulin in the hospital, and then discharged home on insulin 70/30, 15 units 2 times a day. Patient reports that she has been taking her insulin regularly as described. His symptoms of hypoglycemia also improved. She reports that she feels less thirsty right now. She urinates less than before. She does not have blurry vision. She measures her blood sugar 3 times a day. Her CBG ranges are 170 to 280 on 07/07/12.   2. Urinary tract infection, complicated: In the hospital, patient complained of dysuria with 21-50 white blood cells in her urine. Culture showed CITROBACTER KOSERI, which are sensitive to cipro. Patient is being treated with ciprofloxacin 500 mg bid x 7 days. Today she reports that she does not have dysuria, burning or urinary frequency.   3. Tobacco abuse: patient has a smoke 2 packs a week for more than 30 years. Recently she has cut down on her smoking to 4 cigarettes per day. She is not ready to quit yet.  ROS: Denies fever, chills, fatigue, headaches, cough, chest pain, SOB,  abdominal pain,diarrhea, constipation, dysuria, urgency, frequency, hematuria.    Past Medical History  Diagnosis Date  . Obesity   . Hypertension   .  Hypothyroid   . Eclampsia 1988    stroke like symtoms with pregnancy, h/o protenuria with eclampsia   . Peripheral neuropathy     As of 07/2012 patient reports she was dx'ed 25 yrs ago previously taking Neurontin and B12 shot   . Tachycardia   . Dysrhythmia   . Pneumonia ~ 1991    "once" (07/02/2012)  . Migraines     "occasionally" (07/02/2012)  . Daily headache     "for the past 2-3 wks" (07/02/2012)  . Heart murmur 1988    "I had eclampsia; they said I had a stroke" (07/02/2012)  . History of juvenile arthritis     diagnosed age 45 y.o; affects hands, knees   . Depression   . Kidney stones     "once" (07/02/2012)   Current Outpatient Prescriptions  Medication Sig Dispense Refill  . ciprofloxacin (CIPRO) 500 MG tablet Take 1 tablet (500 mg total) by mouth 2 (two) times daily.  12 tablet  0  . insulin NPH-regular (NOVOLIN 70/30) (70-30) 100 UNIT/ML injection Inject 15 Units into the skin daily with supper.      . insulin NPH-regular (NOVOLIN 70/30) (70-30) 100 UNIT/ML injection Inject 17 Units into the skin daily with breakfast.      . Insulin Syringe-Needle U-100 31G X 5/16" 0.3 ML MISC Use needles and syringe to inject insulin as instructed.  100 each  0  . Multiple Vitamin (MULTIVITAMIN WITH MINERALS) TABS Take 1 tablet by mouth daily.       No current facility-administered medications  for this visit.   Family History  Problem Relation Age of Onset  . Diabetes Paternal Uncle    History   Social History  . Marital Status: Divorced    Spouse Name: N/A    Number of Children: N/A  . Years of Education: N/A   Social History Main Topics  . Smoking status: Current Every Day Smoker -- 0.25 packs/day for 30 years    Types: Cigarettes  . Smokeless tobacco: Never Used     Comment: 07/02/2012 offered smoking cessation materials; pt declines  . Alcohol Use: No  . Drug Use: No  . Sexually Active: No   Other Topics Concern  . None   Social History Narrative   2 kids   Smokes 4-5  cigarettes daily.  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  Patient is a well-developed and well-nourished, in no acute distress and cooperative with exam.   HEENT:  Head: Normocephalic and atraumatic Ear: TM normal bilaterally Mouth: no erythema or exudates, MMM Eyes: PERRL, EOMI, conjunctivae normal, No scleral icterus.  Neck: Supple, Trachea midline normal ROM, No JVD, mass, thyromegaly, or carotid bruit present. No lymph node enlargement. Cardiovascular: RRR, S1 normal, S2 normal, no MRG, pulses symmetric and intact bilaterally Pulmonary/Chest: CTAB, no wheezes, rales, or rhonchi Abdominal: Soft. Non-tender, non-distended, bowel sounds are normal, no masses, organomegaly, or guarding present.  GU: no CVA tenderness Musculoskeletal: No joint deformities, erythema, or stiffness, ROM full and non-tender Hematology: no cervical, inginal, or axillary adenopathy.  Neurological: A&O x3, Strength is normal and symmetric bilaterally, cranial nerve II-XII are grossly intact, no focal motor deficit, sensory intact to light touch bilaterally. Brachial reflex 2+ bilaterally. Knee reflex 2+ bilaterally. Babinski's sign negative. Finger to nose test normal. Skin: Warm, dry and intact. No rash, cyanosis, or clubbing.  Psychiatric: Normal mood and affect. speech and behavior is normal. Judgment and thought content normal. Cognition and memory are normal.   Assessment & Plan:

## 2012-07-09 NOTE — Progress Notes (Signed)
I discussed the patient with resident Dr. Niu at the time of the visit.  I agree with the plans as outlined in his note.  

## 2012-07-25 ENCOUNTER — Encounter: Payer: Self-pay | Admitting: Internal Medicine

## 2012-07-25 ENCOUNTER — Encounter: Payer: Self-pay | Admitting: Licensed Clinical Social Worker

## 2012-07-25 ENCOUNTER — Ambulatory Visit (INDEPENDENT_AMBULATORY_CARE_PROVIDER_SITE_OTHER): Payer: Self-pay | Admitting: Internal Medicine

## 2012-07-25 VITALS — BP 124/74 | HR 95 | Temp 97.8°F | Ht 63.0 in | Wt 262.2 lb

## 2012-07-25 DIAGNOSIS — F17200 Nicotine dependence, unspecified, uncomplicated: Secondary | ICD-10-CM

## 2012-07-25 DIAGNOSIS — R269 Unspecified abnormalities of gait and mobility: Secondary | ICD-10-CM

## 2012-07-25 DIAGNOSIS — Z720 Tobacco use: Secondary | ICD-10-CM

## 2012-07-25 DIAGNOSIS — IMO0001 Reserved for inherently not codable concepts without codable children: Secondary | ICD-10-CM

## 2012-07-25 LAB — LIPID PANEL
HDL: 61 mg/dL (ref >39)
LDL Cholesterol: 104 mg/dL — ABNORMAL HIGH (ref 0–99)
Total CHOL/HDL Ratio: 3 Ratio
Triglycerides: 73 mg/dL (ref <150)
VLDL: 15 mg/dL (ref 0–40)

## 2012-07-25 MED ORDER — METFORMIN HCL 1000 MG PO TABS: 1000.0000 mg | ORAL_TABLET | Freq: Two times a day (BID) | ORAL | Status: DC

## 2012-07-25 NOTE — Assessment & Plan Note (Addendum)
Lab Results  Component Value Date   HGBA1C 13.9* 07/02/2012     Assessment: Diabetes control: poor control (HgbA1C >9%) Progress toward A1C goal:  unable to assess Comments: She seems to be having a hard time with meal planning and eating multiple smaller meals a day instead of 1 large meal a day, which she used to do before her diagnosis of diabetes.  Plan: Medications:  Continue 70/30 17u qam and 15u qhs with meals; adding Metformin. Startign with 500mg  daily x1 week and titrating up to 1000mg  BID Home glucose monitoring: Frequency: 3 times a day Timing: before meals Instruction/counseling given: reminded to get eye exam, reminded to bring blood glucose meter & log to each visit and discussed diet Educational resources provided: brochure;handout Self management tools provided: copy of home glucose meter download Other plans: Follow up in 2-3 weeks  **Today I provided info regarding meal planning in addition to starting Metformin. She is to continue checking her CBGs TID b/f meals, and I will see her back in the clinic in 2-3 weeks. Checking lipid panel and Microalbumin/Cr today. She appears to be amenable to our plan.

## 2012-07-25 NOTE — Patient Instructions (Signed)
**Start taking Metformin 500mg  daily for 1 week, then 500mg  twice a day for 1 week, then 1000mg  daily for 1 week, and then 1000mg  twice a day indefinitely.  **Continue the insulin 17u in the morning and 15u at night, with meals.   **Continue to try to eat 3 meals a day. **Continue to check your blood sugar levels three times a day before meals  We will see you back in the clinic in 2-3 weeks.    Diabetes Meal Planning Guide The diabetes meal planning guide is a tool to help you plan your meals and snacks. It is important for people with diabetes to manage their blood glucose (sugar) levels. Choosing the right foods and the right amounts throughout your day will help control your blood glucose. Eating right can even help you improve your blood pressure and reach or maintain a healthy weight. CARBOHYDRATE COUNTING MADE EASY When you eat carbohydrates, they turn to sugar. This raises your blood glucose level. Counting carbohydrates can help you control this level so you feel better. When you plan your meals by counting carbohydrates, you can have more flexibility in what you eat and balance your medicine with your food intake. Carbohydrate counting simply means adding up the total amount of carbohydrate grams in your meals and snacks. Try to eat about the same amount at each meal. Foods with carbohydrates are listed below. Each portion below is 1 carbohydrate serving or 15 grams of carbohydrates. Ask your dietician how many grams of carbohydrates you should eat at each meal or snack. Grains and Starches  1 slice bread.   English muffin or hotdog/hamburger bun.   cup cold cereal (unsweetened).   cup cooked pasta or rice.   cup starchy vegetables (corn, potatoes, peas, beans, winter squash).  1 tortilla (6 inches).   bagel.  1 waffle or pancake (size of a CD).   cup cooked cereal.  4 to 6 small crackers. *Whole grain is recommended. Fruit  1 cup fresh unsweetened berries,  melon, papaya, pineapple.  1 small fresh fruit.   banana or mango.   cup fruit juice (4 oz unsweetened).   cup canned fruit in natural juice or water.  2 tbs dried fruit.  12 to 15 grapes or cherries. Milk and Yogurt  1 cup fat-free or 1% milk.  1 cup soy milk.  6 oz light yogurt with sugar-free sweetener.  6 oz low-fat soy yogurt.  6 oz plain yogurt. Vegetables  1 cup raw or  cup cooked is counted as 0 carbohydrates or a "free" food.  If you eat 3 or more servings at 1 meal, count them as 1 carbohydrate serving. Other Carbohydrates   oz chips or pretzels.   cup ice cream or frozen yogurt.   cup sherbet or sorbet.  2 inch square cake, no frosting.  1 tbs honey, sugar, jam, jelly, or syrup.  2 small cookies.  3 squares of graham crackers.  3 cups popcorn.  6 crackers.  1 cup broth-based soup.  Count 1 cup casserole or other mixed foods as 2 carbohydrate servings.  Foods with less than 20 calories in a serving may be counted as 0 carbohydrates or a "free" food. You may want to purchase a book or computer software that lists the carbohydrate gram counts of different foods. In addition, the nutrition facts panel on the labels of the foods you eat are a good source of this information. The label will tell you how big the serving size is  and the total number of carbohydrate grams you will be eating per serving. Divide this number by 15 to obtain the number of carbohydrate servings in a portion. Remember, 1 carbohydrate serving equals 15 grams of carbohydrate. SERVING SIZES Measuring foods and serving sizes helps you make sure you are getting the right amount of food. The list below tells how big or small some common serving sizes are.  1 oz.........4 stacked dice.  3 oz........Marland KitchenDeck of cards.  1 tsp.......Marland KitchenTip of little finger.  1 tbs......Marland KitchenMarland KitchenThumb.  2 tbs.......Marland KitchenGolf ball.   cup......Marland KitchenHalf of a fist.  1 cup.......Marland KitchenA fist. SAMPLE DIABETES MEAL  PLAN Below is a sample meal plan that includes foods from the grain and starches, dairy, vegetable, fruit, and meat groups. A dietician can individualize a meal plan to fit your calorie needs and tell you the number of servings needed from each food group. However, controlling the total amount of carbohydrates in your meal or snack is more important than making sure you include all of the food groups at every meal. You may interchange carbohydrate containing foods (dairy, starches, and fruits). The meal plan below is an example of a 2000 calorie diet using carbohydrate counting. This meal plan has 17 carbohydrate servings. Breakfast  1 cup oatmeal (2 carb servings).   cup light yogurt (1 carb serving).  1 cup blueberries (1 carb serving).   cup almonds. Snack  1 large apple (2 carb servings).  1 low-fat string cheese stick. Lunch  Chicken breast salad.  1 cup spinach.   cup chopped tomatoes.  2 oz chicken breast, sliced.  2 tbs low-fat [place] dressing.  12 whole-wheat crackers (2 carb servings).  12 to 15 grapes (1 carb serving).  1 cup low-fat milk (1 carb serving). Snack  1 cup carrots.   cup hummus (1 carb serving). Dinner  3 oz broiled salmon.  1 cup brown rice (3 carb servings). Snack  1  cups steamed broccoli (1 carb serving) drizzled with 1 tsp olive oil and lemon juice.  1 cup light pudding (2 carb servings). DIABETES MEAL PLANNING WORKSHEET Your dietician can use this worksheet to help you decide how many servings of foods and what types of foods are right for you.  BREAKFAST Food Group and Servings / Carb Servings Grain/Starches __________________________________ Dairy __________________________________________ Vegetable ______________________________________ Fruit ___________________________________________ Meat __________________________________________ Fat ____________________________________________ LUNCH Food Group and Servings / Carb  Servings Grain/Starches ___________________________________ Dairy ___________________________________________ Fruit ____________________________________________ Meat ___________________________________________ Fat _____________________________________________ Laural Golden Food Group and Servings / Carb Servings Grain/Starches ___________________________________ Dairy ___________________________________________ Fruit ____________________________________________ Meat ___________________________________________ Fat _____________________________________________ SNACKS Food Group and Servings / Carb Servings Grain/Starches ___________________________________ Dairy ___________________________________________ Vegetable _______________________________________ Fruit ____________________________________________ Meat ___________________________________________ Fat _____________________________________________ DAILY TOTALS Starches _________________________ Vegetable ________________________ Fruit ____________________________ Dairy ____________________________ Meat ____________________________ Fat ______________________________ Document Released: 12/14/2004 Document Revised: 06/11/2011 Document Reviewed: 10/25/2008 Exit[employer] Patient Information 2013 [place], [place].  Diabetes and Exercise Regular exercise is important and can help:   Control blood glucose (sugar).  Decrease blood pressure.    Control blood lipids (cholesterol, triglycerides).  Improve overall health. BENEFITS FROM EXERCISE  Improved fitness.  Improved flexibility.  Improved endurance.  Increased bone density.  Weight control.  Increased muscle strength.  Decreased body fat.  Improvement of the body's use of insulin, a hormone.  Increased insulin sensitivity.  Reduction of insulin needs.  Reduced stress and tension.  Helps you feel better. People with diabetes who add exercise to their lifestyle gain  additional benefits, including:  Weight loss.  Reduced appetite.  Improvement  of the body's use of blood glucose.  Decreased risk factors for heart disease:  Lowering of cholesterol and triglycerides.  Raising the level of good cholesterol (high-density lipoproteins, HDL).  Lowering blood sugar.  Decreased blood pressure. TYPE 1 DIABETES AND EXERCISE  Exercise will usually lower your blood glucose.  If blood glucose is greater than 240 mg/dl, check urine ketones. If ketones are present, do not exercise.  Location of the insulin injection sites may need to be adjusted with exercise. Avoid injecting insulin into areas of the body that will be exercised. For example, avoid injecting insulin into:  The arms when playing tennis.  The legs when jogging. For more information, discuss this with your caregiver.  Keep a record of:  Food intake.  Type and amount of exercise.  Expected peak times of insulin action.  Blood glucose levels. Do this before, during, and after exercise. Review your records with your caregiver. This will help you to develop guidelines for adjusting food intake and insulin amounts.  TYPE 2 DIABETES AND EXERCISE  Regular physical activity can help control blood glucose.  Exercise is important because it may:  Increase the body's sensitivity to insulin.  Improve blood glucose control.  Exercise reduces the risk of heart disease. It decreases serum cholesterol and triglycerides. It also lowers blood pressure.  Those who take insulin or oral hypoglycemic agents should watch for signs of hypoglycemia. These signs include dizziness, shaking, sweating, chills, and confusion.  Body water is lost during exercise. It must be replaced. This will help to avoid loss of body fluids (dehydration) or heat stroke. Be sure to talk to your caregiver before starting an exercise program to make sure it is safe for you. Remember, any activity is better than none.   Document Released: 06/09/2003 Document Revised: 06/11/2011 Document Reviewed: 09/23/2008 [facility] Patient Information 2013 [place], [place].

## 2012-07-25 NOTE — Assessment & Plan Note (Signed)
Fall Screening 07/08/2012 07/25/2012 07/25/2012 07/25/2012  Falls in the past year? Yes - No Yes  Number of falls in past year 2 or more 2 or more - 2 or more  Was there an injury with Fall? - - No No  Risk Factor Category  - - High Fall Risk High Fall Risk      Assessment: Progress toward fall prevention goal:  improved   Plan: Fall prevention plans: Uses cane, improving with better blood glucose control Educational resources provided: brochure Self management tools provided:

## 2012-07-25 NOTE — Progress Notes (Signed)
Melanie Henry was referred to CSW by current nurse for information on community resources.  RN states pt moved to [place] to assist with the care of her father.  Father has improved and now assists Melanie Henry.  Pt is newly dx with diabetes and is uninsured.  Pt current complaint at this time is transportation.  Melanie Henry reports having applied for disability over 10 years ago but was denied, no appeal was filed.  Pt was applying for disability for dx of major depression and arthritis.  Pt is currently not receiving any mental health services and denies need at this time, stating her only problem now is her physical health.  Pt c/o difficulty walking due to knee pain/problem and vision problems.  Pt has information on applying for the [facility] orange card, but still in need of having a letter notarized.  Melanie Henry is currently getting diabetic information through [facility] as she was told this was the most reasonably priced location.  Pt denies having any other medication that is not on a $4 list.  CSW informed Melanie Henry of benefits of getting the [facility] card including prescription assistance, transportation to/from [employer] for medical appt's, and referral to other services.  CSW discussed option of getting referral to outpatient physical therapy.  Pt was encouraged to hear of outpatient physical therapy services as she has benefited for therapy in the past.  CSW provided Melanie Henry with information on applying for disability online/[place]  Informing pt of local agencies available to assist with appeal should that be needed.  Also provided Melanie Henry with information on MAP, [employer] transportation brochure, local free vision screening through [employer], and information on [employer].  Pt has CSW contact information and hours.  Pt will call CSW when approved for [facility] card, CSW will complete transportation application.  Pt denies add'l needs at this time.

## 2012-07-25 NOTE — Assessment & Plan Note (Addendum)
BMI 46 today from 45.7 at her last clinic visit and 44.5 at hospital admission. Some of her extra weight might be from her peripheral edema, which I'm not sure why she is experiencing this. If it continues, she will likely need an ECHO. She has seen Norm Parcel, but was unable to really tell me what they talked about. She states that she is trying to work on improving her nutrition. I provided meal planning information and info on diabetes and exercise. We'll see how she does over the next 2-3 weeks.

## 2012-07-25 NOTE — Progress Notes (Signed)
Patient ID: Melanie Henry, female   DOB: [birth year], 52 y.o.   MRN: [id number]  Subjective:   Patient ID: Melanie Henry female   DOB: [birth year] 52 y.o.   MRN: [id number]  HPI: [name] is a 52 y.o. female with newly diagnosed DM2 presents to the clinic for a routine follow up.  She was seen in the clinic on 4/8 for a hospital follow up after being admitted with CBGs in the 500s but no AG acidosis. In the hospital, she was started on Novolog 70/30 15u BID, and at her hospital f/u, her CBGs were improved but still elevated. The insulin was increased to 17u qm with 15u qhs.   Today, the patient reports "I feel awful" on the insulin. She intitally states that she felt that the insulin was making her blood sugars worse, but then corrected, stating that it has improved her blood sugars and her vision has improved, but she feels "exhausted", has a decreased appetite, is occasionally nauseous, and is having headaches.   She also states that she has a h/o of BLE edema that occurs once a month. In the past, she was given Lasix for this, and states that she still takes the Lasix PRN. She states that she has been having worsening peripheral edema since starting the insulin, and actually took 80mg  of Lasix last night, which did improve her edema. She denies any heart issues, but states that her mother did have CHF. She has never had an ECHO,    Past Medical History  Diagnosis Date  . Obesity   . Hypertension   . Hypothyroid   . Eclampsia 1988    stroke like symtoms with pregnancy, h/o protenuria with eclampsia   . Peripheral neuropathy     As of 07/2012 patient reports she was dx'ed 25 yrs ago previously taking Neurontin and B12 shot   . Tachycardia   . Dysrhythmia   . Pneumonia ~ 1991    "once" (07/02/2012)  . Migraines     "occasionally" (07/02/2012)  . Daily headache     "for the past 2-3 wks" (07/02/2012)  . Heart murmur 1988    "I had eclampsia; they said I had a stroke" (07/02/2012)  . History of  juvenile arthritis     diagnosed age 28 y.o; affects hands, knees   . Depression   . Kidney stones     "once" (07/02/2012)   Current Outpatient Prescriptions  Medication Sig Dispense Refill  . insulin NPH-regular (NOVOLIN 70/30) (70-30) 100 UNIT/ML injection Inject 15 Units into the skin daily with supper.      . insulin NPH-regular (NOVOLIN 70/30) (70-30) 100 UNIT/ML injection Inject 17 Units into the skin daily with breakfast.      . Multiple Vitamin (MULTIVITAMIN WITH MINERALS) TABS Take 1 tablet by mouth daily.      . Insulin Syringe-Needle U-100 31G X 5/16" 0.3 ML MISC Use needles and syringe to inject insulin as instructed.  100 each  0  . metFORMIN (GLUCOPHAGE) 1000 MG tablet Take 1 tablet (1,000 mg total) by mouth 2 (two) times daily with a meal.  30 tablet  11   No current facility-administered medications for this visit.   Family History  Problem Relation Age of Onset  . Diabetes Paternal Uncle    History   Social History  . Marital Status: Divorced    Spouse Name: N/A    Number of Children: N/A  . Years of Education: N/A   Social History Main Topics  .  Smoking status: Current Every Day Smoker -- 0.25 packs/day for 30 years    Types: Cigarettes  . Smokeless tobacco: Never Used     Comment: 07/02/2012 offered smoking cessation materials; pt declines  . Alcohol Use: No  . Drug Use: No  . Sexually Active: No   Other Topics Concern  . None   Social History Narrative   2 kids   Smokes 4-5 cigarettes daily.  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  Patient is a morbidly obese female in  no acute distress and cooperative with exam Head: Normocephalic and atraumatic Eyes: PERRL, EOMI  Cardiovascular: RRR, S1 normal, S2 normal, no MRG, pulses symmetric and intact bilaterally Pulmonary/Chest: CTAB, no wheezes, rales, or rhonchi Abdominal: Obese. Non-distended, no guarding present.  Extremities: 2-3+ pitting edema of BLE, 2+ pulses Neurological: A&O x3, nonfocal Psychiatric: Normal mood and affect.   Assessment & Plan:   Please refer to Problem List based Assessment and Plan

## 2012-07-25 NOTE — Assessment & Plan Note (Signed)
  Assessment: Progress toward smoking cessation:  smoking the same amount Barriers to progress toward smoking cessation:   (She's trying to quit)   Plan: Instruction/counseling given:  I advised patient to stop smoking. Educational resources provided:  [facility] (1-800-QUIT-NOW) brochure Self management tools provided:    Medications to assist with smoking cessation:  None

## 2012-07-25 NOTE — Progress Notes (Signed)
Case discussed with Dr. Glenn at the time of the visit, immediately after the resident saw the patient.  I reviewed the resident's history and exam and pertinent patient test results.  I agree with the assessment, diagnosis and plan of care documented in the resident's note.  

## 2012-07-26 LAB — MICROALBUMIN / CREATININE URINE RATIO: Creatinine, Urine: 84.2 mg/dL

## 2012-07-28 NOTE — Addendum Note (Signed)
Addended by: Charlsie Merles F on: 07/28/2012 10:45 AM   Modules accepted: Orders

## 2012-07-29 ENCOUNTER — Encounter: Payer: Self-pay | Admitting: Internal Medicine

## 2012-08-12 ENCOUNTER — Encounter: Payer: Self-pay | Admitting: Internal Medicine

## 2012-08-13 ENCOUNTER — Encounter: Payer: Self-pay | Admitting: Internal Medicine

## 2012-08-13 ENCOUNTER — Other Ambulatory Visit: Payer: Self-pay | Admitting: Internal Medicine

## 2012-08-15 MED ORDER — METFORMIN HCL ER 500 MG PO TB24: 500.0000 mg | ORAL_TABLET | Freq: Every day | ORAL | Status: DC

## 2012-08-15 NOTE — Telephone Encounter (Signed)
Changing Metformin 1000mg  po BID to Metformin XR 500mg  po daily due to GI symptoms (nausea dn diarrhea) on regular Metformin. We will see if she tolerates the  Extended release form better. RX sent to the pt's pharmacy

## 2012-08-26 ENCOUNTER — Ambulatory Visit: Payer: Self-pay

## 2012-08-30 ENCOUNTER — Other Ambulatory Visit: Payer: Self-pay | Admitting: Internal Medicine

## 2012-09-01 ENCOUNTER — Other Ambulatory Visit: Payer: Self-pay | Admitting: *Deleted

## 2012-09-01 MED ORDER — "INSULIN SYRINGE-NEEDLE U-100 31G X 5/16"" 0.3 ML MISC": Status: DC

## 2012-09-03 NOTE — Addendum Note (Signed)
Addended by: Bufford Spikes on: 09/03/2012 10:59 AM   Modules accepted: Orders

## 2012-09-09 ENCOUNTER — Encounter: Payer: Self-pay | Admitting: Dietician

## 2012-10-09 ENCOUNTER — Other Ambulatory Visit: Payer: Self-pay

## 2012-11-11 ENCOUNTER — Ambulatory Visit: Payer: Self-pay

## 2012-11-19 ENCOUNTER — Ambulatory Visit: Payer: Self-pay

## 2012-12-10 ENCOUNTER — Encounter: Payer: Self-pay | Admitting: Internal Medicine

## 2012-12-10 ENCOUNTER — Ambulatory Visit (INDEPENDENT_AMBULATORY_CARE_PROVIDER_SITE_OTHER): Payer: No Typology Code available for payment source | Admitting: Internal Medicine

## 2012-12-10 VITALS — BP 128/78 | HR 93 | Temp 98.9°F | Wt 259.1 lb

## 2012-12-10 DIAGNOSIS — Z720 Tobacco use: Secondary | ICD-10-CM

## 2012-12-10 DIAGNOSIS — Z0000 Encounter for general adult medical examination without abnormal findings: Secondary | ICD-10-CM

## 2012-12-10 DIAGNOSIS — IMO0001 Reserved for inherently not codable concepts without codable children: Secondary | ICD-10-CM

## 2012-12-10 DIAGNOSIS — F17200 Nicotine dependence, unspecified, uncomplicated: Secondary | ICD-10-CM

## 2012-12-10 DIAGNOSIS — R269 Unspecified abnormalities of gait and mobility: Secondary | ICD-10-CM

## 2012-12-10 LAB — POCT GLYCOSYLATED HEMOGLOBIN (HGB A1C): Hemoglobin A1C: 6.2

## 2012-12-10 MED ORDER — INSULIN NPH ISOPHANE & REGULAR (70-30) 100 UNIT/ML ~~LOC~~ SUSP: SUBCUTANEOUS | Status: DC

## 2012-12-10 NOTE — Assessment & Plan Note (Signed)
Lab Results  Component Value Date   HGBA1C 6.2 12/10/2012   HGBA1C 13.9* 07/02/2012     Assessment: Diabetes control: good control (HgbA1C at goal) Progress toward A1C goal:  at goal Comments:   Plan: Medications:  continue current medications Home glucose monitoring: Frequency:   occasionally Timing:   Instruction/counseling given: reminded to bring blood glucose meter & log to each visit Educational resources provided: brochure (Simultaneous filing. User may not have seen previous data.) Self management tools provided: copy of home glucose meter download Other plans: Patient's A1c improved significantly from 13.9 to 6.2. She did not have hypoglycemia episode. She could not tolerate metformin because of nausea, vomiting and diarrhea. Will discontinue metformin from now. Will continue current insulin 70/30, 17 units in morning and 15 units in evening.

## 2012-12-10 NOTE — Assessment & Plan Note (Signed)
Fall Screening 07/08/2012 07/25/2012 07/25/2012 07/25/2012 12/10/2012  Falls in the past year? Yes - No Yes Yes  Number of falls in past year 2 or more 2 or more - 2 or more 2 or more  Was there an injury with Fall? - - No No -  Risk Factor Category  - - High Fall Risk High Fall Risk High Fall Risk      Assessment: Progress toward fall prevention goal:  improved Comments:   Plan: Fall prevention plans: use cane.  Educational resources provided: brochure (Designer, industrial/product. User may not have seen previous data.) Self management tools provided: home fall prevention checklist

## 2012-12-10 NOTE — Assessment & Plan Note (Signed)
-   Patient refused Pap smear, pneumococcal vaccination, flu shot, colonoscopy, mammogram. - she reports that she had eye examination possibly on 3/14.

## 2012-12-10 NOTE — Progress Notes (Addendum)
Patient ID: Melanie Henry, female   DOB: 1960-06-08, 52 y.o.   MRN: [id number] Subjective:   Patient ID: Melanie Henry female   DOB: 1961/02/01 52 y.o.   MRN: [id number]  CC: Follow up visit. HPI:  Melanie Henry is a 52 y.o. lady with past medical history as outlined below, who presents for a followup visit today  1.  DM-II: Patient's diabetes used to be poorly controlled. Her medication was adjusted on 4/25. Currently she is taking insulin 70/30 insulin twice a day, 17 units in the morning and 15 units in the evening. In addition she was supposed to take metformin, which patient stopped taking it because of the side effects (severe nausea, vomiting and diarrhea). Patient reports that she has been taking her insulin regularly until 2 days ago, when she ran out of her insulin prescription. Her A1c improved from 13.9 on 07/02/12 to 6.2 on 12/10/12. Patient measures her blood sugar level randomly. The data showed that her blood sugar level has been well-controlled with the lowest CBG of 69 and highest CBG of 150 mg/dl in last month. Patient does not have any symptoms for hypo- or hyperglycemia.   2.  Fall risk:  It has been stable. No recent falls.  The etiology is b/c of rheumatoid arthritis which she has since age of 20 per patient. She's not taking any medications for rheumatoid arthritis. It was not recorded in Epic.   3. Smoking: She is a still smoking, 4-5 cigarettes per day. She is not ready to quit yet.  ROS:  Denies fever, chills, fatigue, headaches,  cough, chest pain, SOB,  abdominal pain,diarrhea, constipation, dysuria, urgency, frequency, hematuria.    Past Medical History  Diagnosis Date  . Obesity   . Hypertension   . Hypothyroid   . Eclampsia 1988    stroke like symtoms with pregnancy, h/o protenuria with eclampsia   . Peripheral neuropathy     As of 07/2012 patient reports she was dx'ed 25 yrs ago previously taking Neurontin and B12 shot   . Tachycardia   . Dysrhythmia   . Pneumonia ~  1991    "once" (07/02/2012)  . Migraines     "occasionally" (07/02/2012)  . Daily headache     "for the past 2-3 wks" (07/02/2012)  . Heart murmur 1988    "I had eclampsia; they said I had a stroke" (07/02/2012)  . 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 Depression   . Kidney stones     "once" (07/02/2012)   Current Outpatient Prescriptions  Medication Sig Dispense Refill  . insulin NPH-regular (NOVOLIN 70/30) (70-30) 100 UNIT/ML injection Inject 15 Units into the skin daily with supper.      . insulin NPH-regular (NOVOLIN 70/30) (70-30) 100 UNIT/ML injection Inject 17 Units into the skin daily with breakfast.      . Insulin Syringe-Needle U-100 31G X 5/16" 0.3 ML MISC Use three times daily for insulin injection. diag code 250.02. Insulin dependent  100 each  0  . metFORMIN (GLUCOPHAGE XR) 500 MG 24 hr tablet Take 1 tablet (500 mg total) by mouth daily with breakfast.  30 tablet  11  . Multiple Vitamin (MULTIVITAMIN WITH MINERALS) TABS Take 1 tablet by mouth daily.       No current facility-administered medications for this visit.   Family History  Problem Relation Age of Onset  . Diabetes Paternal Uncle    History   Social History  .  Marital Status: Divorced    Spouse Name: N/A    Number of Children: N/A  . Years of Education: N/A   Social History Main Topics  . Smoking status: Current Every Day Smoker -- 0.25 packs/day for 30 years    Types: Cigarettes  . Smokeless tobacco: Never Used     Comment: 07/02/2012 offered smoking cessation materials; pt declines  . Alcohol Use: No  . Drug Use: No  . Sexual Activity: No   Other Topics Concern  . Not on file   Social History Narrative   2 kids   Smokes 4-5 cigarettes daily.  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 See HPI.   Objective:  Physical Exam: There were no vitals  filed for this visit.   Constitutional: Vital signs reviewed.  Patient is a morbidly obese female in no acute distress and cooperative with exam Head: Normocephalic and atraumatic Eyes: PERRL, EOMI  Cardiovascular: RRR, S1 normal, S2 normal, no MRG, pulses symmetric and intact bilaterally Pulmonary/Chest: CTAB, no wheezes, rales, or rhonchi Abdominal: Obese. Non-distended, no guarding present.  Extremities: No leg edema (she had 2+ leg edema in her previous visit).  Neurological: A&O x3, nonfocal Psychiatric: Normal mood and affect.   Assessment & Plan:   Addendum: 12/10/12 1:07PM Patient is getting her insulin from health department. I was called and told that there is no Novolin 70/30 insulin available from health department. Will switch to Humulin 70/30 insulin at the same does (17 units in AM and 15 units in PM).   Lorretta Harp, MD PGY3, Internal Medicine Teaching Service Pager: [phone]

## 2012-12-10 NOTE — Assessment & Plan Note (Signed)
  Assessment: Progress toward smoking cessation:  smoking less Barriers to progress toward smoking cessation:  lack of motivation to quit Comments:   Plan: Instruction/counseling given:  I counseled patient on the dangers of tobacco use, advised patient to stop smoking, and reviewed strategies to maximize success. Educational resources provided:  [employer]) brochure (Simultaneous filing. User may not have seen previous data.) Self management tools provided:  smoking cessation plan (STAR Quit Plan) Medications to assist with smoking cessation:  None Patient agreed to the following self-care plans for smoking cessation: cut down the number of cigarettes smoked;go to the [employer] website ([employer].ee)  Other plans:

## 2012-12-10 NOTE — Patient Instructions (Signed)
1. Please restart taking your insulin as prescribed. Please check your blood sugar level at least twice a day and bring in your meter in your next visit.  2. Please take all medications as prescribed.  3. If you have worsening of your symptoms or new symptoms arise, please call the clinic ([phone]), or go to the ER immediately if symptoms are severe.

## 2012-12-10 NOTE — Addendum Note (Signed)
Addended by: Lorretta Harp on: 12/10/2012 01:09 PM   Modules accepted: Orders, Medications

## 2012-12-15 NOTE — Progress Notes (Signed)
Case discussed with Dr. Niu soon after the resident saw the patient.  We reviewed the resident's history and exam and pertinent patient test results.  I agree with the assessment, diagnosis, and plan of care documented in the resident's note. 

## 2012-12-18 ENCOUNTER — Encounter: Payer: Self-pay | Admitting: Internal Medicine

## 2012-12-18 ENCOUNTER — Ambulatory Visit (INDEPENDENT_AMBULATORY_CARE_PROVIDER_SITE_OTHER): Payer: No Typology Code available for payment source | Admitting: Internal Medicine

## 2012-12-18 VITALS — BP 121/74 | HR 97 | Temp 97.8°F | Ht 63.0 in | Wt 260.6 lb

## 2012-12-18 DIAGNOSIS — Z720 Tobacco use: Secondary | ICD-10-CM

## 2012-12-18 DIAGNOSIS — Z0000 Encounter for general adult medical examination without abnormal findings: Secondary | ICD-10-CM

## 2012-12-18 DIAGNOSIS — R269 Unspecified abnormalities of gait and mobility: Secondary | ICD-10-CM

## 2012-12-18 DIAGNOSIS — IMO0001 Reserved for inherently not codable concepts without codable children: Secondary | ICD-10-CM

## 2012-12-18 DIAGNOSIS — L732 Hidradenitis suppurativa: Secondary | ICD-10-CM | POA: Insufficient documentation

## 2012-12-18 DIAGNOSIS — F17200 Nicotine dependence, unspecified, uncomplicated: Secondary | ICD-10-CM

## 2012-12-18 MED ORDER — CLINDAMYCIN HCL 300 MG PO CAPS: 300.0000 mg | ORAL_CAPSULE | Freq: Three times a day (TID) | ORAL | Status: DC

## 2012-12-18 NOTE — Assessment & Plan Note (Addendum)
Pt states that she did get her diabetic eye exam.   She is refusing a mammogram, colonoscopy, flu shot, tetanus booster, and a pneumococcal vaccine - the risks of not getting the screening exams was discussed in detail with the patient, and she acknowledged complete understanding. I will try to discuss with her further at her next visit.   She states that she would like to be referred to GYN for a pap smear/pelvic exam- referral has been made to GYN

## 2012-12-18 NOTE — Progress Notes (Signed)
Patient ID: [name], female   DOB: 1961/03/25, 52 y.o.   MRN: [id number]  Subjective:   Patient ID: Melanie Henry female   DOB: 1960/05/02 52 y.o.   MRN: [id number]  HPI: [name] is a 52 y.o. F with PMH DM2, tobacco abuse, and gait abnormality requiring a cane presents for a routine follow up.   Please see Problem List for HPI  Past Medical History  Diagnosis Date  . Obesity   . Hypertension   . Hypothyroid   . Eclampsia 1988    stroke like symtoms with pregnancy, h/o protenuria with eclampsia   . Peripheral neuropathy     As of 07/2012 patient reports she was dx'ed 25 yrs ago previously taking Neurontin and B12 shot   . Tachycardia   . Dysrhythmia   . Pneumonia ~ 1991    "once" (07/02/2012)  . Migraines     "occasionally" (07/02/2012)  . Daily headache     "for the past 2-3 wks" (07/02/2012)  . Heart murmur 1988    "I had eclampsia; they said I had a stroke" (07/02/2012)  . History of juvenile arthritis     diagnosed age 80 y.o; affects hands, knees   . Depression   . Kidney stones     "once" (07/02/2012)   Current Outpatient Prescriptions  Medication Sig Dispense Refill  . insulin NPH-regular (HUMULIN 70/30) (70-30) 100 UNIT/ML injection Inject twice a day, 17 units in AM and 15 units in evening.  10 mL  11  . insulin NPH-regular (NOVOLIN 70/30) (70-30) 100 UNIT/ML injection Please inject 70/30 Insulin twice a day: 17 units in the Morning and 15 units in the Evening.      . Insulin Syringe-Needle U-100 31G X 5/16" 0.3 ML MISC Use three times daily for insulin injection. diag code 250.02. Insulin dependent  100 each  0  . Multiple Vitamin (MULTIVITAMIN WITH MINERALS) TABS Take 1 tablet by mouth daily.       No current facility-administered medications for this visit.   Family History  Problem Relation Age of Onset  . Diabetes Paternal Uncle    History   Social History  . Marital Status: Divorced    Spouse Name: N/A    Number of Children: N/A  . Years of Education: N/A    Social History Main Topics  . Smoking status: Current Every Day Smoker -- 0.25 packs/day for 30 years    Types: Cigarettes  . Smokeless tobacco: Never Used     Comment: 07/02/2012 offered smoking cessation materials; pt declines  . Alcohol Use: No  . Drug Use: No  . Sexual Activity: No   Other Topics Concern  . None   Social History Narrative   2 kids   Smokes 4-5 cigarettes daily.  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 + decreased appetite and fatigue.  HEENT: Denies photophobia, eye pain, redness, hearing loss, ear pain, congestion, sore throat, rhinorrhea, sneezing, mouth sores, trouble swallowing, neck pain, neck stiffness and tinnitus.   Respiratory: Denies SOB, DOE, cough, chest tightness,  and wheezing.   Cardiovascular: Denies chest pain, palpitations. Occasional BLE edema that improves with Lasix, improving since her hospital admission in April. Gastrointestinal: Denies nausea, vomiting, abdominal pain, diarrhea, constipation, blood in stool and abdominal distention.  Genitourinary: Denies dysuria, urgency, frequency, hematuria, flank pain and difficulty urinating.  Endocrine: Denies: hot or  cold intolerance, sweats, changes in hair or nails, polyuria, polydipsia. Musculoskeletal: Denies myalgias, back pain, joint swelling, arthralgias and gait problem.  Skin: +hidradenitis under her left breast with 2 draining sites that are tender Neurological: Denies dizziness, seizures, syncope, weakness, light-headedness, numbness and headaches.  Psychiatric/Behavioral: Denies suicidal ideation, mood changes, confusion, nervousness, sleep disturbance and agitation  Objective:  Physical Exam: Filed Vitals:   12/18/12 1527  BP: 121/74  Pulse: 97  Temp: 97.8 F (36.6 C)  TempSrc: Oral  Height: 5\' 3"  (1.6 m)  Weight: 260 lb 9.6 oz (118.207 kg)  SpO2: 97%    Constitutional: Vital signs reviewed.  Patient is an obese female in no acute distress and cooperative with exam. Alert and oriented x3.  Head: Normocephalic and atraumatic Eyes: PERRL, EOMI, conjunctivae normal, No scleral icterus.  Cardiovascular: RRR, S1 normal, S2 normal, no MRG, pulses symmetric and intact bilaterally, trace pitting edema to BLE at the shins Pulmonary/Chest: normal respiratory effort, CTAB, no wheezes, rales, or rhonchi Abdominal: Soft. Non-tender, non-distended Musculoskeletal: No joint deformities, erythema, and no nontender Neurological: A&O x3, Strength is normal and symmetric bilaterally, cranial nerve II-XII are grossly intact Skin: 2 large indurated, erythematous abscesses under her left breast. The more lateral site feels fluctuant and is draining purulent fluid, the more medial site does not feel fluctuant and and is not currently draining.  She has areas of scarring under her left breast which she state is from previous abscess sites.  Psychiatric: Normal mood and affect. speech and behavior is normal. Judgment and thought content appear normal. Cognition and memory are normal.   Assessment & Plan:   Please refer to Problem List based Assessment and Plan

## 2012-12-18 NOTE — Assessment & Plan Note (Signed)
  Assessment: Progress toward smoking cessation:  smoking the same amount Barriers to progress toward smoking cessation:  withdrawal symptoms and stress Comments: Pt states that she is down from 2ppd, and will continue to try to wean down her cigarette use, but currently she states that she has too many stressors to quit completely.   Plan: Instruction/counseling given:  I counseled patient on the dangers of tobacco use, advised patient to stop smoking, and reviewed strategies to maximize success. Educational resources provided:  [employer]) brochure Self management tools provided:    Medications to assist with smoking cessation:  None Patient agreed to the following self-care plans for smoking cessation:   She will try to continue to wean down her cigarette use.

## 2012-12-18 NOTE — Assessment & Plan Note (Signed)
Patient states she's history of hidradenitis suppurativa, and was diagnosed in her 16s. She states she has seen a Careers adviser in the past but opted not to have surgery. She's not seen anyone recently for this. She endorses chronic abscesses under her breasts and in the skin folds of her groin. She endorses 2 areas under her left breast at the present for the past 2 weeks and continue to drain purulent fluid. She endorses significant pain in this area. She states usually when these abscesses appear to plus hot compresses and is able to drain them herself and then they go away within a matter of 5 days. Unfortunately this did not happen with these 2 sites under her left breast. On exam the appear infected more lateral site is fluctuant, but is open and purulent material is able to be expressed and is draining. The other site was not actively draining on exam but does not appear fluctuant. Both sites are indurated and erythematous and tender to palpation, the more lateral site is significantly more tender. She states that she has had worse before. Does have multiple scars under her breast from previous abscess sites. -Starting clindamycin 300 mg by mouth 3 times a day x10 days -Return to clinic in 2 weeks for evaluation of the abscess sites -Have referred the patient to general surgery, unfortunately they are booked solids until the end of the year for patients with the orange card. The patient states she is unable to travel to [place] or to [facility], and states she will wait until she's able to be seen here [place], as she has "had this for the past 30 years".

## 2012-12-18 NOTE — Patient Instructions (Addendum)
**  Check you blood sugar levels 3 times a day before meals and keep a log of the recordings for the next 2 weeks to bring to your clinic appointment. Please be sure to eat regular meals daily- do not take your insulin if you do not eat!! This is very important!  **Take the Clindamycin 300mg  tablets, 1 tablet three times a day for 10 days. I want you to follow up in clinic in 2 weeks so I can see how you are doing. I have placed a referral in for General Surgery. If your hidradenitis worsen, you may need to be seen at [facility].  Hidradenitis Suppurativa, Sweat Gland Abscess Hidradenitis suppurativa is a long lasting (chronic), uncommon disease of the sweat glands. With this, boil-like lumps and scarring develop in the groin, some times under the arms (axillae), and under the breasts. It may also uncommonly occur behind the ears, in the crease of the buttocks, and around the genitals.  CAUSES  The cause is from a blocking of the sweat glands. They then become infected. It may cause drainage and odor. It is not contagious. So it cannot be given to someone else. It most often shows up in puberty (about 55 to 50 years of age). But it may happen much later. It is similar to acne which is a disease of the sweat glands. This condition is slightly more common in African-Americans and women. SYMPTOMS   Hidradenitis usually starts as one or more red, tender, swellings in the groin or under the arms (axilla).  Over a period of hours to days the lesions get larger. They often open to the skin surface, draining clear to yellow-colored fluid.  The infected area heals with scarring. DIAGNOSIS  Your caregiver makes this diagnosis by looking at you. Sometimes cultures (growing germs on plates in the lab) may be taken. This is to see what germ (bacterium) is causing the infection.  TREATMENT   Topical germ killing medicine applied to the skin (antibiotics) are the treatment of choice. Antibiotics taken by mouth  (systemic) are sometimes needed when the condition is getting worse or is severe.  Avoid tight-fitting clothing which traps moisture in.  Dirt does not cause hidradenitis and it is not caused by poor hygiene.  Involved areas should be cleaned daily using an antibacterial soap. Some patients find that the liquid form of Lever 2000, applied to the involved areas as a lotion after bathing, can help reduce the odor related to this condition.  Sometimes surgery is needed to drain infected areas or remove scarred tissue. Removal of large amounts of tissue is used only in severe cases.  Birth control pills may be helpful.  Oral retinoids (vitamin A derivatives) for 6 to 12 months which are effective for acne may also help this condition.  Weight loss will improve but not cure hidradenitis. It is made worse by being overweight. But the condition is not caused by being overweight.  This condition is more common in people who have had acne.  It may become worse under stress. There is no medical cure for hidradenitis. It can be controlled, but not cured. The condition usually continues for years with periods of getting worse and getting better (remission). Document Released: 11/01/2003 Document Revised: 06/11/2011 Document Reviewed: 11/17/2007 [facility] Patient Information 2014 [place], [place].

## 2012-12-18 NOTE — Assessment & Plan Note (Signed)
Fall Screening 07/25/2012 07/25/2012 07/25/2012 12/10/2012 12/18/2012  Falls in the past year? - No Yes Yes Yes  Number of falls in past year 2 or more - 2 or more 2 or more 2 or more  Was there an injury with Fall? - No No - -  Risk Factor Category  - High Fall Risk High Fall Risk High Fall Risk -      Assessment: Progress toward fall prevention goal:  unchanged Comments: No falls. Stable  Plan: Fall prevention plans: Uses cane for ambulation and has been very steady with this Educational resources provided: brochure;handout Self management tools provided:

## 2012-12-18 NOTE — Assessment & Plan Note (Signed)
Lab Results  Component Value Date   HGBA1C 6.2 12/10/2012   HGBA1C 13.9* 07/02/2012     Assessment: Diabetes control: good control (HgbA1C at goal) Progress toward A1C goal:  at goal Comments: She seems to be doing really well with her diabetes with her A1c. However, she endorses hypoglycemic events when she does not eat and takes her 70/30 insulin. She states that she is not eating regular meals which she says is secondary to poor appetite but also do to financial constraints and states there is a limited supply food and she will stricture that her father always has something to eat, sometimes she'll go hungry. When this occurs the next day she feels ravenous and makes poor food choices such as eating a plate of french fries which she says has happened before. She brought her glucometer in today unfortunately there were only 4 CBG readings available. She states that she recently changed the date on the glucometer and thinks that the other CBG values were deleted. She endorses checking her blood sugars everyday.  Plan: Medications:  continue current medications Home glucose monitoring: Frequency: 3 times a day Timing: before meals Instruction/counseling given: reminded to bring blood glucose meter & log to each visit Educational resources provided: brochure;handout Self management tools provided: copy of home glucose meter download Other plans: We discussed the importance of eating a balanced diet and not skipping meals, and especially not using her insulin when she doesn't eat. We discussed this in depth and the seriousness of having hypoglycemic episodes. She acknowledged understanding, and states that she will do better about not skipping meals. She states that she is going to apply for food stamps to help support herself and her family.   For the next 2 weeks she's going to check her blood sugars 3 times a day before meals and keep a log of the recordings. She is to hold her insulin if she does  not eat. She is to work on eating a more balanced diet. She is to bring in her logbook of her CBGs over the next 2 weeks to me in clinic at her followup appointment in 2 weeks. At that time we will assess if her that this medication needs to be altered.

## 2012-12-19 NOTE — Progress Notes (Signed)
INTERNAL MEDICINE TEACHING ATTENDING ADDENDUM - Jonah Blue, DO, FACP: I reviewed with the resident Dr. Sherrine Maples, Clement Sayres Seats's  medical history, physical examination, diagnosis and results of tests and treatment and I agree with the patient's care as documented.

## 2013-01-01 ENCOUNTER — Ambulatory Visit: Payer: No Typology Code available for payment source | Admitting: Internal Medicine

## 2013-02-04 ENCOUNTER — Encounter: Payer: Self-pay | Admitting: Obstetrics & Gynecology

## 2013-02-04 ENCOUNTER — Ambulatory Visit (INDEPENDENT_AMBULATORY_CARE_PROVIDER_SITE_OTHER): Payer: No Typology Code available for payment source | Admitting: Obstetrics & Gynecology

## 2013-02-04 VITALS — BP 124/86 | HR 96 | Temp 97.9°F | Ht 65.0 in | Wt 263.5 lb

## 2013-02-04 DIAGNOSIS — N912 Amenorrhea, unspecified: Secondary | ICD-10-CM

## 2013-02-04 DIAGNOSIS — Z01419 Encounter for gynecological examination (general) (routine) without abnormal findings: Secondary | ICD-10-CM

## 2013-02-04 DIAGNOSIS — A630 Anogenital (venereal) warts: Secondary | ICD-10-CM

## 2013-02-04 NOTE — Patient Instructions (Signed)
Genital Warts Genital warts are a sexually transmitted infection. They may appear as small bumps on the tissues of the genital area. CAUSES  Genital warts are caused by a virus called human papillomavirus (HPV). HPV is the most common sexually transmitted disease (STD) and infection of the sex organs. This infection is spread by having unprotected sex with an infected person. It can be spread by vaginal, anal, and oral sex. Many people do not know they are infected. They may be infected for years without problems. However, even if they do not have problems, they can unknowingly pass the infection to their sexual partners. SYMPTOMS   Itching and irritation in the genital area.  Warts that bleed.  Painful sexual intercourse. DIAGNOSIS  Warts are usually recognized with the naked eye on the vagina, vulva, perineum, anus, and rectum. Certain tests can also diagnose genital warts, such as:  A Pap test.  A tissue sample (biopsy) exam.  Colposcopy. A magnifying tool is used to examine the vagina and cervix. The HPV cells will change color when certain solutions are used. TREATMENT  Warts can be removed by:  Applying certain chemicals, such as cantharidin or podophyllin.  Liquid nitrogen freezing (cryotherapy).  Immunotherapy with candida or trichophyton injections.  Laser treatment.  Burning with an electrified probe (electrocautery).  Interferon injections.  Surgery. PREVENTION  HPV vaccination can help prevent HPV infections that cause genital warts and that cause cancer of the cervix. It is recommended that the vaccination be given to people between the ages 7 to 73 years old. The vaccine might not work as well or might not work at all if you already have HPV. It should not be given to pregnant women. HOME CARE INSTRUCTIONS   It is important to follow your caregiver's instructions. The warts will not go away without treatment. Repeat treatments are often needed to get rid of warts.  Even after it appears that the warts are gone, the normal tissue underneath often remains infected.  Do not try to treat genital warts with medicine used to treat hand warts. This type of medicine is strong and can burn the skin in the genital area, causing more damage.  Tell your past and current sexual partner(s) that you have genital warts. They may be infected also and need treatment.  Avoid sexual contact while being treated.  Do not touch or scratch the warts. The infection may spread to other parts of your body.  Women with genital warts should have a cervical cancer check (Pap test) at least once a year. This type of cancer is slow-growing and can be cured if found early. Chances of developing cervical cancer are increased with HPV.  Inform your obstetrician about your warts in the event of pregnancy. This virus can be passed to the baby's respiratory tract. Discuss this with your caregiver.  Use a condom during sexual intercourse. Following treatment, the use of condoms will help prevent reinfection.  Ask your caregiver about using over-the-counter anti-itch creams. SEEK MEDICAL CARE IF:   Your treated skin becomes red, swollen, or painful.  You have a fever.  You feel generally ill.  You feel little lumps in and around your genital area.  You are bleeding or have painful sexual intercourse. MAKE SURE YOU:   Understand these instructions.  Will watch your condition.  Will get help right away if you are not doing well or get worse. Document Released: 03/16/2000 Document Revised: 06/11/2011 Document Reviewed: 09/25/2010 [facility] Patient Information 2014 [place], [place]. Menopause  Menopause is the normal time of life when menstrual periods stop completely. Menopause is complete when you have missed 12 consecutive menstrual periods. It usually occurs between the ages of 48 years and 55 years. Very rarely does a woman develop menopause before the age of 40 years. At  menopause, your ovaries stop producing the female hormones estrogen and progesterone. This can cause undesirable symptoms and also affect your health. Sometimes the symptoms may occur 4 5 years before the menopause begins. There is no relationship between menopause and:  Oral contraceptives.  Number of children you had.  Race.  The age your menstrual periods started (menarche). Heavy smokers and very thin women may develop menopause earlier in life. CAUSES  The ovaries stop producing the female hormones estrogen and progesterone.  Other causes include:  Surgery to remove both ovaries.  The ovaries stop functioning for no known reason.  Tumors of the pituitary gland in the brain.  Medical disease that affects the ovaries and hormone production.  Radiation treatment to the abdomen or pelvis.  Chemotherapy that affects the ovaries. SYMPTOMS   Hot flashes.  Night sweats.  Decrease in sex drive.  Vaginal dryness and thinning of the vagina causing painful intercourse.  Dryness of the skin and developing wrinkles.  Headaches.  Tiredness.  Irritability.  Memory problems.  Weight gain.  Bladder infections.  Hair growth of the face and chest.  Infertility. More serious symptoms include:  Loss of bone (osteoporosis) causing breaks (fractures).  Depression.  Hardening and narrowing of the arteries (atherosclerosis) causing heart attacks and strokes. DIAGNOSIS   When the menstrual periods have stopped for 12 straight months.  Physical exam.  Hormone studies of the blood. TREATMENT  There are many treatment choices and nearly as many questions about them. The decisions to treat or not to treat menopausal changes is an individual choice made with your health care provider. Your health care provider can discuss the treatments with you. Together, you can decide which treatment will work best for you. Your treatment choices may include:   Hormone therapy (estrogen  and progesterone).  Non-hormonal medicines.  Treating the individual symptoms with medicine (for example antidepressants for depression).  Herbal medicines that may help specific symptoms.  Counseling by a psychiatrist or psychologist.  Group therapy.  Lifestyle changes including:  Eating healthy.  Regular exercise.  Limiting caffeine and alcohol.  Stress management and meditation.  No treatment. HOME CARE INSTRUCTIONS   Take the medicine your health care provider gives you as directed.  Get plenty of sleep and rest.  Exercise regularly.  Eat a diet that contains calcium (good for the bones) and soy products (acts like estrogen hormone).  Avoid alcoholic beverages.  Do not smoke.  If you have hot flashes, dress in layers.  Take supplements, calcium, and vitamin D to strengthen bones.  You can use over-the-counter lubricants or moisturizers for vaginal dryness.  Group therapy is sometimes very helpful.  Acupuncture may be helpful in some cases. SEEK MEDICAL CARE IF:   You are not sure you are in menopause.  You are having menopausal symptoms and need advice and treatment.  You are still having menstrual periods after age 67 years.  You have pain with intercourse.  Menopause is complete (no menstrual period for 12 months) and you develop vaginal bleeding.  You need a referral to a specialist (gynecologist, psychiatrist, or psychologist) for treatment. SEEK IMMEDIATE MEDICAL CARE IF:   You have severe depression.  You have excessive vaginal bleeding.  You fell and think you have a broken bone.  You have pain when you urinate.  You develop leg or chest pain.  You have a fast pounding heart beat (palpitations).  You have severe headaches.  You develop vision problems.  You feel a lump in your breast.  You have abdominal pain or severe indigestion. Document Released: 06/09/2003 Document Revised: 11/19/2012 Document Reviewed:  10/16/2012 [facility] Patient Information 2014 [place], [place].

## 2013-02-05 ENCOUNTER — Other Ambulatory Visit: Payer: Self-pay

## 2013-02-06 NOTE — Progress Notes (Signed)
Patient ID: Melanie Henry, female   DOB: [birth year], 52 y.o.   MRN: [id number] Subjective:     Melanie Henry is a 52 y.o. female here for a routine exam.  Current complaints: condyloma.  Previously resolved and now back  Last PAP>10 years Last mammogram >5 years    Gynecologic History No LMP recorded. Patient is not currently having periods (Reason: Perimenopausal). Past Medical History  Diagnosis Date  . Obesity   . Hypertension   . Hypothyroid   . Eclampsia 1988    stroke like symtoms with pregnancy, h/o protenuria with eclampsia   . Peripheral neuropathy     As of 07/2012 patient reports she was dx'ed 25 yrs ago previously taking Neurontin and B12 shot   . Tachycardia   . Dysrhythmia   . Pneumonia ~ 1991    "once" (07/02/2012)  . Migraines     "occasionally" (07/02/2012)  . Daily headache     "for the past 2-3 wks" (07/02/2012)  . Heart murmur 1988    "I had eclampsia; they said I had a stroke" (07/02/2012)  . History of juvenile arthritis     diagnosed age 47 y.o; affects hands, knees   . Depression   . Kidney stones     "once" (07/02/2012)  . Hidradenitis suppurativa    Past Surgical History  Procedure Laterality Date  . Cesarean section  1988; 1990  . Ankle fusion Right 1980's    "pinned" (07/02/2012)  . Thumb fusion Right 1970's    "pinned; reattached tendon" (07/02/2012)  . Tonsillectomy  1980  . Tubal ligation  03/1989; 06/1990     Obstetric History OB History  No data available     The following portions of the patient's history were reviewed and updated as appropriate: allergies, current medications, past family history, past medical history, past social history, past surgical history and problem list.  Review of Systems A comprehensive review of systems was negative.    Objective:    BP 124/86  Pulse 96  Temp(Src) 97.9 F (36.6 C) (Oral)  Ht 5\' 5"  (1.651 m)  Wt 263 lb 8 oz (119.523 kg)  BMI 43.85 kg/m2  General Appearance:    Alert, cooperative, no distress,  appears stated age                 Neck:   Supple, symmetrical, trachea midline, no adenopathy;    thyroid:  no enlargement/tenderness/nodules; no carotid   bruit or JVD  Back:     Symmetric, no curvature, ROM normal, no CVA tenderness  Lungs:     Clear to auscultation bilaterally, respirations unlabored  Chest Wall:    No tenderness or deformity   Heart:    Regular rate and rhythm, S1 and S2 normal, no murmur, rub   or gallop  Breast Exam:    No tenderness, masses, or nipple abnormality- breasts are pendulus there are abscesses of the skin UNDER the breast on the left  Abdomen:     Soft, non-tender, bowel sounds active all four quadrants,    no masses, no organomegaly  Genitalia:    Normal female without lesion, discharge or tenderness  Rectal:  Condyloma noted in perianal area  Extremities:   Extremities normal, atraumatic, no cyanosis or edema  Pulses:   2+ and symmetric all extremities  Skin:   Skin color, texture, turgor normal, no rashes or lesions  Lymph nodes:   Cervical, supraclavicular, and axillary nodes normal  Assessment:    Healthy female exam.  Condyloma accuminata   Plan:    Mammogram ordered.   F/u 1 year or sooner prn Pt to f/u for condymola removal prn

## 2013-02-09 ENCOUNTER — Encounter: Payer: Self-pay | Admitting: Family Medicine

## 2013-02-10 ENCOUNTER — Telehealth: Payer: Self-pay | Admitting: General Practice

## 2013-02-10 NOTE — Telephone Encounter (Signed)
Called patient, no answer- left message stating that we are trying to get in touch with you with some information, please call us back at the clinics

## 2013-02-10 NOTE — Telephone Encounter (Signed)
Message copied by Kathee Delton on Tue Feb 10, 2013  3:36 PM ------      Message from: Melanie Henry      Created: Mon Feb 09, 2013  5:23 PM       Please call pt.  Her PAP showed HR HPV, but, was read as normal.  She should have a repeat PAP in 1 year.            Thx            clh-S        ------

## 2013-02-12 ENCOUNTER — Encounter: Payer: Self-pay | Admitting: Internal Medicine

## 2013-02-12 ENCOUNTER — Ambulatory Visit (HOSPITAL_COMMUNITY)
Admission: RE | Admit: 2013-02-12 | Discharge: 2013-02-12 | Disposition: A | Discharge status: Home / Self Care | Payer: No Typology Code available for payment source | Source: Ambulatory Visit | Attending: Internal Medicine | Admitting: Internal Medicine

## 2013-02-12 ENCOUNTER — Ambulatory Visit (INDEPENDENT_AMBULATORY_CARE_PROVIDER_SITE_OTHER): Payer: No Typology Code available for payment source | Admitting: Internal Medicine

## 2013-02-12 VITALS — BP 133/80 | HR 77 | Temp 97.4°F | Ht 63.75 in | Wt 267.9 lb

## 2013-02-12 DIAGNOSIS — L732 Hidradenitis suppurativa: Secondary | ICD-10-CM

## 2013-02-12 DIAGNOSIS — F17200 Nicotine dependence, unspecified, uncomplicated: Secondary | ICD-10-CM

## 2013-02-12 DIAGNOSIS — R002 Palpitations: Secondary | ICD-10-CM | POA: Insufficient documentation

## 2013-02-12 DIAGNOSIS — Z720 Tobacco use: Secondary | ICD-10-CM

## 2013-02-12 MED ORDER — INSULIN NPH ISOPHANE & REGULAR (70-30) 100 UNIT/ML ~~LOC~~ SUSP: SUBCUTANEOUS | Status: DC

## 2013-02-12 MED ORDER — CLINDAMYCIN HCL 300 MG PO CAPS: 300.0000 mg | ORAL_CAPSULE | Freq: Three times a day (TID) | ORAL | Status: DC

## 2013-02-12 NOTE — Assessment & Plan Note (Addendum)
Original sites at her left breast have improved since using the clindamycin, but she has one remaining site in the skin fold of her left breast that is still draining a small amount of purulence. Will treat with Clindamycin for another 14 days. She is to return to the clinic in 2 weeks if the drainage is not better. She is on the list for General Surgery, but the next appointments are not until after the New Year- she is aware of this.

## 2013-02-12 NOTE — Patient Instructions (Signed)
**  Decrease your insulin doses to 15 units in the morning and 13 units at night. Be sure to check your blood sugars at least once a day before breakfast and up to 3 times a day before meals. Please bring your meter to your next clinic visit.   Try to join the [facility]- pool exercises will be good low impact exercise for you.   I am prescribing Clindamycin for you again to take for 14 days. Please return to the clinic in 2 weeks if the drainage under your breast has not improved. Otherwise return to the clinic in 1 month.

## 2013-02-12 NOTE — Assessment & Plan Note (Addendum)
Lab Results  Component Value Date   HGBA1C 6.2 12/10/2012   HGBA1C 13.9* 07/02/2012     Assessment: Diabetes control: good control (HgbA1C at goal) Progress toward A1C goal:  unchanged Comments: Per pt, her CBGs have been more elevated over the past 2-3 weeks. She denies any dietary indiscretions or medication noncompliance. I suspect that these episodes of feeling full body tremors are likely related to issues with her blood glucose control. Concern that she might be having episodes of hypoglycemia. Check an EKG today which showed normal sinus rhythm with a rate of 69 beats per minute, normal QTc, no ST changes.  Plan: Medications:  continue current medications Home glucose monitoring: Frequency: 3 times a day Timing: before meals Instruction/counseling given: reminded to bring blood glucose meter & log to each visit and discussed diet Educational resources provided: brochure;handout Self management tools provided:   Other plans: Return to the clinic in 1 month for A1c check and to bring her glucometer at her next visit, so I can adjust her insulin at that time if needed. I am not going to make any adjustments at this time because she did not bring her meter today, and I don't know how high her sugars have been or if she has had any hypoglycemic events.

## 2013-02-12 NOTE — Assessment & Plan Note (Signed)
  Assessment: Progress toward smoking cessation:  smoking less Barriers to progress toward smoking cessation:  lack of motivation to quit   Plan: Instruction/counseling given:  I counseled patient on the dangers of tobacco use, advised patient to stop smoking, and reviewed strategies to maximize success. Educational resources provided:  [employer]) brochure Self management tools provided:    Medications to assist with smoking cessation:  None Patient agreed to the following self-care plans for smoking cessation:    Other plans: Pt is not ready to quit but states that she is gradually smoking less

## 2013-02-12 NOTE — Progress Notes (Addendum)
Patient ID: Melanie Henry, female   DOB: 1961/03/31, 52 y.o.   MRN: [id number]  Subjective:   Patient ID: Melanie Henry female   DOB: 23-Feb-1961 52 y.o.   MRN: [id number]  HPI: [name] is a 52 y.o. PMH DM2, hydradenitis suppurativa, tobacco abuse, and gait abnormality requiring a cane presents for a f/u for her DM2 and hydradenitis suppurativa.  CBGs elevated into the 170-180s and into the 220s per pt. She states that this has been going on for the past 2-3 weeks. She states that she has also had the feeling of her whole body "trembling" over the same time period. She states that her heart also feels like she is having palpitations. She denies any chest pain, but states that she is having these palpitations/trembling while in the exam room. She denies any changes to her medications and states that she is compliant with her 70/30 insulin 17u qam and 15u qhs. When asked if she is having any hypoglycemic episodes, she states "maybe, I'm not sure."   She was seen in September with hydradenitis suppurativa and was treated with Clindamycin x10 days. She states that the site has improved and is less tender but it is still draining purulent, foul smelling fluid. She was placed on the list for General Surgery here in town who accept the [facility] card, knowing that the wait is into early next year.   She was seen by GYN for her pap on 11/5. Pap +for HPV but read as normal. Pt to have repeat in 1 year. Mammogram scheduled.    Past Medical History  Diagnosis Date  . Obesity   . Hypertension   . Hypothyroid   . Eclampsia 1988    stroke like symtoms with pregnancy, h/o protenuria with eclampsia   . Peripheral neuropathy     As of 07/2012 patient reports she was dx'ed 25 yrs ago previously taking Neurontin and B12 shot   . Tachycardia   . Dysrhythmia   . Pneumonia ~ 1991    "once" (07/02/2012)  . Migraines     "occasionally" (07/02/2012)  . Daily headache     "for the past 2-3 wks" (07/02/2012)  . Heart murmur  1988    "I had eclampsia; they said I had a stroke" (07/02/2012)  . History of juvenile arthritis     diagnosed age 65 y.o; affects hands, knees   . Depression   . Kidney stones     "once" (07/02/2012)  . Hidradenitis suppurativa    Current Outpatient Prescriptions  Medication Sig Dispense Refill  . clindamycin (CLEOCIN) 300 MG capsule Take 1 capsule (300 mg total) by mouth 3 (three) times daily.  30 capsule  0  . insulin NPH-regular (HUMULIN 70/30) (70-30) 100 UNIT/ML injection Inject twice a day, 17 units in AM and 15 units in evening.  10 mL  11  . Insulin Syringe-Needle U-100 31G X 5/16" 0.3 ML MISC Use three times daily for insulin injection. diag code 250.02. Insulin dependent  100 each  0  . Multiple Vitamin (MULTIVITAMIN WITH MINERALS) TABS Take 1 tablet by mouth daily.       No current facility-administered medications for this visit.   Family History  Problem Relation Age of Onset  . Diabetes Paternal Uncle    History   Social History  . Marital Status: Divorced    Spouse Name: N/A    Number of Children: N/A  . Years of Education: N/A   Social History Main Topics  . Smoking  status: Current Every Day Smoker -- 0.25 packs/day for 30 years    Types: Cigarettes  . Smokeless tobacco: Never Used     Comment: 07/02/2012 offered smoking cessation materials; pt declines  . Alcohol Use: No  . Drug Use: No  . Sexual Activity: No   Other Topics Concern  . None   Social History Narrative   2 kids   Smokes 4-5 cigarettes daily.  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  Patient is a  obese female in no acute distress and cooperative with exam.   Head: Normocephalic and atraumatic Eyes: PERRL, EOMI, conjunctivae normal, No scleral icterus.  Cardiovascular: RRR, S1 normal, S2 normal, no MRG Pulmonary/Chest: normal respiratory effort, CTAB, no wheezes, rales, or rhonchi Abdominal: Obese, distended. Soft. Non-tender Musculoskeletal: No joint deformities, moves all four extremities Neurological: A&O x3, cranial nerve II-XII are grossly intact, no focal motor deficit. No visible tremor. Skin: +hidradenitis sites under her left breast improved and not draining. Site in skin fold of her left breast is smaller and less TTP but is still draining small amounts of purulence with mild odor.  Psychiatric: Normal mood with flattened affect. speech and behavior is normal.    Assessment & Plan:   Please refer to Problem List based Assessment and Plan

## 2013-02-16 NOTE — Progress Notes (Signed)
Case discussed with Dr. Glenn at the time of the visit.  We reviewed the resident's history and exam and pertinent patient test results.  I agree with the assessment, diagnosis, and plan of care documented in the resident's note.   

## 2013-02-18 ENCOUNTER — Encounter: Payer: Self-pay | Admitting: *Deleted

## 2013-02-18 NOTE — Telephone Encounter (Signed)
Called patient again, and left another message we are trying to reach you with some important information, please call clinic

## 2013-02-18 NOTE — Telephone Encounter (Signed)
Will send letter as patient has not called back, and now we have called twice.

## 2013-02-23 ENCOUNTER — Encounter: Payer: Self-pay | Admitting: *Deleted

## 2013-03-06 ENCOUNTER — Ambulatory Visit: Payer: No Typology Code available for payment source | Admitting: Family Medicine

## 2013-03-11 ENCOUNTER — Encounter: Payer: Self-pay | Admitting: Internal Medicine

## 2013-03-11 ENCOUNTER — Ambulatory Visit (INDEPENDENT_AMBULATORY_CARE_PROVIDER_SITE_OTHER): Payer: No Typology Code available for payment source | Admitting: Internal Medicine

## 2013-03-11 VITALS — BP 135/98 | HR 104 | Temp 97.1°F | Ht 64.0 in | Wt 267.6 lb

## 2013-03-11 DIAGNOSIS — L732 Hidradenitis suppurativa: Secondary | ICD-10-CM

## 2013-03-11 DIAGNOSIS — F17200 Nicotine dependence, unspecified, uncomplicated: Secondary | ICD-10-CM

## 2013-03-11 DIAGNOSIS — Z720 Tobacco use: Secondary | ICD-10-CM

## 2013-03-11 LAB — GLUCOSE, CAPILLARY: Glucose-Capillary: 121 mg/dL — ABNORMAL HIGH (ref 70–99)

## 2013-03-11 LAB — POCT GLYCOSYLATED HEMOGLOBIN (HGB A1C): Hemoglobin A1C: 7.2

## 2013-03-11 MED ORDER — INSULIN NPH ISOPHANE & REGULAR (70-30) 100 UNIT/ML ~~LOC~~ SUSP: SUBCUTANEOUS | Status: DC

## 2013-03-11 NOTE — Assessment & Plan Note (Addendum)
Lab Results  Component Value Date   HGBA1C 7.2 03/11/2013   HGBA1C 6.2 12/10/2012   HGBA1C 13.9* 07/02/2012     Assessment: Diabetes control: fair control Progress toward A1C goal:  deteriorated Comments: Pt states that she would like help with recipes. She has had her eye exam done, just trying to get the reports.  Plan: Medications:  continue current medications, but increasing the Novolog 70/30 to 18u qam and 15u qhs Home glucose monitoring: Frequency:   Timing:   Instruction/counseling given: reminded to bring blood glucose meter & log to each visit, reminded to bring medications to each visit, discussed the need for weight loss and discussed diet Educational resources provided: brochure;handout Self management tools provided: copy of home glucose meter download Other plans: f/u in 3 mo

## 2013-03-11 NOTE — Assessment & Plan Note (Signed)
  Assessment: Progress toward smoking cessation:  smoking the same amount Barriers to progress toward smoking cessation:      Plan: Instruction/counseling given:  I counseled patient on the dangers of tobacco use, advised patient to stop smoking, and reviewed strategies to maximize success. Educational resources provided:  [employer]) brochure

## 2013-03-11 NOTE — Progress Notes (Signed)
Patient ID: Melanie Henry, female   DOB: October 24, 1960, 52 y.o.   MRN: [id number]  Subjective:   Patient ID: Melanie Henry female   DOB: March 31, 1961 52 y.o.   MRN: [id number]  HPI: [name] is a 52 y.o. F w/ PMH DM2, hydradenitis suppurativa, tobacco abuse, and gait abnormality  Presents today with increasing blood sugar levels after stopping the Metformin 2/2 abdominal discomfort/diarrhea. She is currently on Novolog 70/30 15u in the morning and 13u at night. She has not had any hypoglycemia and her CBGs are running in the upper 100s-200s. She did bring her meter with her today and CBGs are diffusely high without any hypoglycemia  She states that she has gotten outside and exercised a few times since her last clinic visit.  She has the scholarship information for the [facility] but has not heard back yet.  She states that her hydradenitis suppurativa with infection did imporve somewhat with the Clindamycin, but she is now having drainage again from the same site. She states that it is no worse than before. We have discussed referring her to General Surgery for further evalaution of the draining sites. In the past we have also discussed good hygiene as the best control for these sites.  Past Medical History  Diagnosis Date  . Obesity   . Hypertension   . Hypothyroid   . Eclampsia 1988    stroke like symtoms with pregnancy, h/o protenuria with eclampsia   . Peripheral neuropathy     As of 07/2012 patient reports she was dx'ed 25 yrs ago previously taking Neurontin and B12 shot   . Tachycardia   . Dysrhythmia   . Pneumonia ~ 1991    "once" (07/02/2012)  . Migraines     "occasionally" (07/02/2012)  . Daily headache     "for the past 2-3 wks" (07/02/2012)  . Heart murmur 1988    "I had eclampsia; they said I had a stroke" (07/02/2012)  . History of juvenile arthritis     diagnosed age 36 y.o; affects hands, knees   . Depression   . Kidney stones     "once" (07/02/2012)  . Hidradenitis suppurativa     Current Outpatient Prescriptions  Medication Sig Dispense Refill  . Insulin Syringe-Needle U-100 31G X 5/16" 0.3 ML MISC Use three times daily for insulin injection. diag code 250.02. Insulin dependent  100 each  0  . Multiple Vitamin (MULTIVITAMIN WITH MINERALS) TABS Take 1 tablet by mouth daily.      . insulin NPH-regular (HUMULIN 70/30) (70-30) 100 UNIT/ML injection Inject twice a day, 18 units in AM and 15 units in evening.  10 mL  11   No current facility-administered medications for this visit.   Family History  Problem Relation Age of Onset  . Diabetes Paternal Uncle    History   Social History  . Marital Status: Divorced    Spouse Name: N/A    Number of Children: N/A  . Years of Education: N/A   Social History Main Topics  . Smoking status: Current Every Day Smoker -- 0.25 packs/day for 30 years    Types: Cigarettes  . Smokeless tobacco: Never Used     Comment: 07/02/2012 offered smoking cessation materials; pt declines  . Alcohol Use: No  . Drug Use: No  . Sexual Activity: No   Other Topics Concern  . None   Social History Narrative   2 kids   Smokes 4-5 cigarettes daily.  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   Objective:  Physical Exam: Filed Vitals:   03/11/13 1515  BP: 135/98  Pulse: 104  Temp: 97.1 F (36.2 C)  TempSrc: Oral  Height: 5\' 4"  (1.626 m)  Weight: 267 lb 9.6 oz (121.383 kg)  SpO2: 97%   Constitutional: Vital signs reviewed.  Patient is an obese female in no acute distress and cooperative with exam.  Head: Normocephalic and atraumatic Eyes: PERRL, EOMI, conjunctivae normal, No scleral icterus.  Neck: Normal ROM Cardiovascular: RRR, no MRG, pulses symmetric and intact bilaterally Pulmonary/Chest: normal respiratory effort, CTAB, no wheezes, rales, or rhonchi Abdominal: Soft.  Non-tender, non-distended, Musculoskeletal: Moves all 4 extremities. Uses cane.  Neurological: A&O x3 Psychiatric: Normal mood and affect. speech and behavior is normal.  Assessment & Plan:   Please refer to Problem List based Assessment and Plan

## 2013-03-12 NOTE — Progress Notes (Signed)
Case discussed with Dr. Sherrine Maples soon after the resident saw the patient.  We reviewed the resident's history and exam and pertinent patient test results.  I agree with the assessment, diagnosis and plan of care documented in the resident's note.  At the follow-up visit we will also discuss the importance of loose fitting clothing, weight loss, diabetes control, and decreased sugars in the diet as other possible ways to manage her hidradenitis suppurativa.

## 2013-03-12 NOTE — Assessment & Plan Note (Signed)
Currently stable, per patient. Will refer to Gen. Surgery after the new year when they will have open slots, if the site on her breast continues to drain and if it becomes infected again. She is to to continue to keep the area clean and as dry as possible.

## 2013-03-23 ENCOUNTER — Telehealth: Payer: Self-pay | Admitting: Dietician

## 2013-03-23 NOTE — Telephone Encounter (Signed)
Dr. Jacelyn Pi office confirmed that patient had an eye exam there on 08/28/2012. They are faxing [place] the report.

## 2013-03-24 NOTE — Telephone Encounter (Signed)
Great.  Thanks

## 2013-04-22 ENCOUNTER — Encounter: Payer: Self-pay | Admitting: Dietician

## 2013-05-20 ENCOUNTER — Encounter: Payer: Self-pay | Admitting: *Deleted

## 2013-06-11 ENCOUNTER — Encounter: Payer: Self-pay | Admitting: Internal Medicine

## 2013-06-11 NOTE — Addendum Note (Signed)
Addended by: Neomia DearPOWERS, Burna Atlas E on: 06/11/2013 10:56 PM   Modules accepted: Orders

## 2013-07-09 ENCOUNTER — Other Ambulatory Visit: Payer: Self-pay

## 2013-09-23 ENCOUNTER — Ambulatory Visit: Payer: Self-pay

## 2013-10-26 ENCOUNTER — Ambulatory Visit: Payer: No Typology Code available for payment source

## 2013-11-12 ENCOUNTER — Encounter: Payer: Self-pay | Admitting: Internal Medicine

## 2013-11-12 ENCOUNTER — Ambulatory Visit (INDEPENDENT_AMBULATORY_CARE_PROVIDER_SITE_OTHER): Payer: No Typology Code available for payment source | Admitting: Internal Medicine

## 2013-11-12 VITALS — BP 144/97 | HR 95 | Temp 98.9°F | Wt 266.8 lb

## 2013-11-12 DIAGNOSIS — L732 Hidradenitis suppurativa: Secondary | ICD-10-CM

## 2013-11-12 DIAGNOSIS — IMO0001 Reserved for inherently not codable concepts without codable children: Secondary | ICD-10-CM

## 2013-11-12 DIAGNOSIS — R5381 Other malaise: Secondary | ICD-10-CM

## 2013-11-12 DIAGNOSIS — F17200 Nicotine dependence, unspecified, uncomplicated: Secondary | ICD-10-CM

## 2013-11-12 DIAGNOSIS — Z0000 Encounter for general adult medical examination without abnormal findings: Secondary | ICD-10-CM

## 2013-11-12 DIAGNOSIS — R5383 Other fatigue: Secondary | ICD-10-CM

## 2013-11-12 DIAGNOSIS — Z720 Tobacco use: Secondary | ICD-10-CM

## 2013-11-12 DIAGNOSIS — E1165 Type 2 diabetes mellitus with hyperglycemia: Principal | ICD-10-CM

## 2013-11-12 LAB — CBC WITH DIFFERENTIAL/PLATELET
Basophils Absolute: 0 10*3/uL (ref 0.0–0.1)
Basophils Relative: 0 % (ref 0–1)
EOS PCT: 2 % (ref 0–5)
Eosinophils Absolute: 0.1 10*3/uL (ref 0.0–0.7)
HEMATOCRIT: 42 % (ref 36.0–46.0)
HEMOGLOBIN: 14.8 g/dL (ref 12.0–15.0)
LYMPHS ABS: 1.6 10*3/uL (ref 0.7–4.0)
LYMPHS PCT: 30 % (ref 12–46)
MCH: 29.3 pg (ref 26.0–34.0)
MCHC: 35.2 g/dL (ref 30.0–36.0)
MCV: 83.2 fL (ref 78.0–100.0)
MONO ABS: 0.4 10*3/uL (ref 0.1–1.0)
MONOS PCT: 8 % (ref 3–12)
NEUTROS ABS: 3.2 10*3/uL (ref 1.7–7.7)
Neutrophils Relative %: 60 % (ref 43–77)
Platelets: 339 10*3/uL (ref 150–400)
RBC: 5.05 MIL/uL (ref 3.87–5.11)
RDW: 14.1 % (ref 11.5–15.5)
WBC: 5.4 10*3/uL (ref 4.0–10.5)

## 2013-11-12 LAB — BASIC METABOLIC PANEL WITH GFR
BUN: 3 mg/dL — ABNORMAL LOW (ref 6–23)
CO2: 30 mEq/L (ref 19–32)
CREATININE: 0.66 mg/dL (ref 0.50–1.10)
Calcium: 9.5 mg/dL (ref 8.4–10.5)
Chloride: 101 mEq/L (ref 96–112)
GFR, Est African American: >89 mL/min
GFR, Est Non African American: >89 mL/min
GLUCOSE: 140 mg/dL — AB (ref 70–99)
Potassium: 3.5 mEq/L (ref 3.5–5.3)
SODIUM: 140 meq/L (ref 135–145)

## 2013-11-12 LAB — LIPID PANEL
CHOL/HDL RATIO: 3.7 ratio
CHOLESTEROL: 211 mg/dL — AB (ref 0–200)
HDL: 57 mg/dL (ref >39)
LDL Cholesterol: 135 mg/dL — ABNORMAL HIGH (ref 0–99)
Triglycerides: 96 mg/dL (ref <150)
VLDL: 19 mg/dL (ref 0–40)

## 2013-11-12 LAB — C-REACTIVE PROTEIN: CRP: 5.4 mg/dL — ABNORMAL HIGH (ref <0.60)

## 2013-11-12 LAB — RHEUMATOID FACTOR: Rhuematoid fact SerPl-aCnc: <10 IU/mL (ref <=14)

## 2013-11-12 LAB — POCT GLYCOSYLATED HEMOGLOBIN (HGB A1C): Hemoglobin A1C: 9.2

## 2013-11-12 LAB — GLUCOSE, CAPILLARY: Glucose-Capillary: 183 mg/dL — ABNORMAL HIGH (ref 70–99)

## 2013-11-12 MED ORDER — GLIPIZIDE 5 MG PO TABS: 2.5000 mg | ORAL_TABLET | Freq: Every day | ORAL | Status: DC

## 2013-11-12 NOTE — Progress Notes (Signed)
Patient ID: Melanie Henry, female   DOB: June 05, 1960, 53 y.o.   MRN: [id number]  Subjective:   Patient ID: Melanie Henry female   DOB: 02-07-1961 53 y.o.   MRN: [id number]  HPI: [name] is a 53 y.o. F w/ PMH DM2, hydradenitis suppurativa, tobacco abuse, and gait abnormality who present for a routine f/u.  She was last seen in Dec '14 c/o increased CBGs after stopping the Metformin 2/2 Gi discomfort. At that time her 70/30 insulin was increased to 18u qam and 15u qhs. Unfortunately her A1c today is increased to 9.2 from 7.2 in Dec. She states that her CBGs were in the 300s since her last visit until the past month, they have been improving. She denies any hypoglycemia. She has never been on a sulfonylurea.   She is also c/o pain and stiffness in her shoulder and hip and feels fatigued all the time. She does walk with a cane and is not performing any exercises or stretching. Weight is stable from Dec at 266lb from 267lbs. She states that her left shoulder is stiff and painful and is clicking with rotation. She also endorses pain in her R hip, with pain and occasional swelling and warmth in her hands at the knuckles. She states that she was diagnosed as a child with RA, but is not being treated- she states that she has never been treated for the RA as it was "never really, really bad."  She continues to decline health maintenance screenings, including a mammogram and colonoscopy.   She denies fevers, chills, chest pain, abd pain, changes in urination. +fatigue, decreased appetite, malaise, SOB, DOE, nausea, pain in left shoulder and right hip, and burning/tingling in her feet.   Past Medical History  Diagnosis Date  . Obesity   . Hypertension   . Hypothyroid   . Eclampsia 1988    stroke like symtoms with pregnancy, h/o protenuria with eclampsia   . Peripheral neuropathy     As of 07/2012 patient reports she was dx'ed 25 yrs ago previously taking Neurontin and B12 shot   . Tachycardia   .  Dysrhythmia   . Pneumonia ~ 1991    "once" (07/02/2012)  . Migraines     "occasionally" (07/02/2012)  . Daily headache     "for the past 2-3 wks" (07/02/2012)  . Heart murmur 1988    "I had eclampsia; they said I had a stroke" (07/02/2012)  . History of juvenile arthritis     diagnosed age 47 y.o; affects hands, knees   . Depression   . Kidney stones     "once" (07/02/2012)  . Hidradenitis suppurativa    Current Outpatient Prescriptions  Medication Sig Dispense Refill  . insulin NPH-regular (HUMULIN 70/30) (70-30) 100 UNIT/ML injection Inject twice a day, 18 units in AM and 15 units in evening.  10 mL  11  . Insulin Syringe-Needle U-100 31G X 5/16" 0.3 ML MISC Use three times daily for insulin injection. diag code 250.02. Insulin dependent  100 each  0  . Multiple Vitamin (MULTIVITAMIN WITH MINERALS) TABS Take 1 tablet by mouth daily.       No current facility-administered medications for this visit.   Family History  Problem Relation Age of Onset  . Diabetes Paternal Uncle    History   Social History  . Marital Status: Divorced    Spouse Name: N/A    Number of Children: N/A  . Years of Education: N/A   Social History Main Topics  .  Smoking status: Current Every Day Smoker -- 0.25 packs/day for 30 years    Types: Cigarettes  . Smokeless tobacco: Never Used     Comment: 07/02/2012 offered smoking cessation materials; pt declines  . Alcohol Use: No  . Drug Use: No  . Sexual Activity: No   Other Topics Concern  . None   Social History Narrative   2 kids   Smokes 4-5 cigarettes daily.  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  Patient is a well-developed and well-nourished female in no acute distress and cooperative with exam. Alert and oriented x3.  Head: Normocephalic and atraumatic Eyes: PERRL, EOMI  Cardiovascular: RRR, no MRG Pulmonary/Chest: normal respiratory effort Abdominal: Obese. S, NT. Musculoskeletal: +decreased ROM of left shoulder- exam diminished 2/2 pain. Neurological: A&O x3. CN 2-12 grossly intact. Unable to assess strength in L shoulder, other wise normal in L hand, LLE, and RU&LE. Ambulates with a cane. Psychiatric: Normal mood and affect. speech and behavior is normal.   Assessment & Plan:   Please refer to Problem List based Assessment and Plan

## 2013-11-12 NOTE — Assessment & Plan Note (Signed)
  Assessment: Progress toward smoking cessation:  smoking the same amount Barriers to progress toward smoking cessation:  lack of motivation to quit  Plan: Instruction/counseling given:  I counseled patient on the dangers of tobacco use, advised patient to stop smoking, and reviewed strategies to maximize success.

## 2013-11-12 NOTE — Assessment & Plan Note (Addendum)
Lab Results  Component Value Date   HGBA1C 9.2 11/12/2013   HGBA1C 7.2 03/11/2013   HGBA1C 6.2 12/10/2012     Assessment: Diabetes control: poor control (HgbA1C >9%) Progress toward A1C goal:  deteriorated Comments: Pt on 70/30 17-18u qam and 15u qhs. CBGs in 200-300 per pt, despite reportedly healthy eating. She did not tolerate metformin in the past and has never been on a sulfonylurea.   Plan: Medications:  continue current medications, and start low dose glipizide 2.5mg  daily. Home glucose monitoring: Frequency: once a day Timing: before meals Instruction/counseling given: reminded to bring blood glucose meter & log to each visit, reminded to bring medications to each visit, discussed the need for weight loss and discussed diet Other plans: Checking lipid panel, urine microabumin/Cr, and eye exam. Pt will need foot exam at next visit.  Pt to check CBGs 2x day before breakfast and dinner and keep log book to bring in next week F/u in 1 week and will reassess glipizide dose at that time.

## 2013-11-12 NOTE — Patient Instructions (Signed)
Begin taking glipizide 2.5mg  daily. We will likely need to increase the dose. Check your blood sugars 2 times a day before breakfast and dinner and keep a log book of the recordings. Please bring the log with you next week.    General Instructions:   Please bring your medicines with you each time you come to clinic.  Medicines may include prescription medications, over-the-counter medications, herbal remedies, eye drops, vitamins, or other pills.   Progress Toward Treatment Goals:  Treatment Goal 03/11/2013  Hemoglobin A1C deteriorated  Stop smoking smoking the same amount  Prevent falls unchanged    Self Care Goals & Plans:  Self Care Goal 03/11/2013  Manage my medications take my medicines as prescribed; bring my medications to every visit; refill my medications on time; follow the sick day instructions if I am sick  Monitor my health keep track of my blood glucose; bring my glucose meter and log to each visit; keep track of my blood pressure; check my feet daily  Eat healthy foods eat more vegetables; eat fruit for snacks and desserts; drink diet soda or water instead of juice or soda  Be physically active find an activity I enjoy  Stop smoking -    Home Blood Glucose Monitoring 02/12/2013  Check my blood sugar 3 times a day  When to check my blood sugar before meals     Care Management & Community Referrals:  Referral 07/25/2012  Referrals made for care management support nutritionist; diabetes educator; social worker  Referrals made to community resources transportation; noncommercial health insurance options

## 2013-11-12 NOTE — Assessment & Plan Note (Signed)
Pt states that her symptoms are flaring. She is to return to the clinic next week for further evaluation. We discussed surgery referral in the past; however she may need Dermatology referral which will be tricky [facility]. Humira and Remicade have also been shown to be effective therapy, which may require referral to Rheumatology for evaluation and treatment.

## 2013-11-12 NOTE — Assessment & Plan Note (Addendum)
Pt refusing a mammogram, colonoscopy, flu shot, tetanus booster, and a pneumococcal vaccine - the risks of not getting the screening exams was discussed in detail with the patient, and she acknowledged complete understanding.   She did have her pap smear/pelvic exam from GYN: + condyloma acuminata. Her PAP showed HR HPV, but, was read as normal.  Repeat PAP in 1 year per GYN, Nov 2015.

## 2013-11-12 NOTE — Assessment & Plan Note (Signed)
Per patient, increased fatigue and malaise that is worse with exertion. She states that she was diagnosed with RA as a child but has never been treated or followed by a Rheumatologist. Will check for autoimmune source for her fatigue, but it may be related to her uncontrolled DM2 and physical deconditioning. Will also check CBC to r/o anemia and B12 for deficiency.  - Checking BMP, CBC, B12, UA, ANA, ESR, CRP, RF, CCP

## 2013-11-13 ENCOUNTER — Encounter: Payer: Self-pay | Admitting: Internal Medicine

## 2013-11-13 LAB — URINALYSIS, ROUTINE W REFLEX MICROSCOPIC
BILIRUBIN URINE: NEGATIVE
Glucose, UA: NEGATIVE mg/dL
HGB URINE DIPSTICK: NEGATIVE
KETONES UR: NEGATIVE mg/dL
Leukocytes, UA: NEGATIVE
NITRITE: NEGATIVE
PROTEIN: NEGATIVE mg/dL
Specific Gravity, Urine: 1.008 (ref 1.005–1.030)
UROBILINOGEN UA: 0.2 mg/dL (ref 0.0–1.0)
pH: 6.5 (ref 5.0–8.0)

## 2013-11-13 LAB — CYCLIC CITRUL PEPTIDE ANTIBODY, IGG

## 2013-11-13 LAB — MICROALBUMIN / CREATININE URINE RATIO
CREATININE, URINE: 115.8 mg/dL
MICROALB UR: 1.16 mg/dL (ref 0.00–1.89)
Microalb Creat Ratio: 10 mg/g (ref 0.0–30.0)

## 2013-11-13 LAB — SEDIMENTATION RATE: Sed Rate: 38 mm/hr — ABNORMAL HIGH (ref 0–22)

## 2013-11-13 LAB — VITAMIN B12: Vitamin B-12: 1058 pg/mL — ABNORMAL HIGH (ref 211–911)

## 2013-11-13 LAB — ANA: Anti Nuclear Antibody(ANA): NEGATIVE

## 2013-11-16 NOTE — Progress Notes (Signed)
Case discussed with Dr. Glenn soon after the resident saw the patient.  We reviewed the resident's history and exam and pertinent patient test results.  I agree with the assessment, diagnosis, and plan of care documented in the resident's note. 

## 2013-11-18 ENCOUNTER — Encounter: Payer: Self-pay | Admitting: Internal Medicine

## 2013-11-19 ENCOUNTER — Encounter: Payer: Self-pay | Admitting: Internal Medicine

## 2013-11-19 ENCOUNTER — Ambulatory Visit (INDEPENDENT_AMBULATORY_CARE_PROVIDER_SITE_OTHER): Payer: No Typology Code available for payment source | Admitting: Internal Medicine

## 2013-11-19 VITALS — BP 118/70 | HR 85 | Temp 97.7°F | Ht 64.0 in | Wt 269.1 lb

## 2013-11-19 DIAGNOSIS — L732 Hidradenitis suppurativa: Secondary | ICD-10-CM

## 2013-11-19 DIAGNOSIS — E785 Hyperlipidemia, unspecified: Secondary | ICD-10-CM

## 2013-11-19 MED ORDER — CLINDAMYCIN HCL 300 MG PO CAPS: 300.0000 mg | ORAL_CAPSULE | Freq: Four times a day (QID) | ORAL | Status: DC

## 2013-11-19 NOTE — Assessment & Plan Note (Addendum)
Her total cholesterol and LDL were elevated at 211 and 135, respectively. Pt is not on a statin; want to begin one today, but patient is refusing. She wants to manage her cholesterol with diet and exercise. Discussed the risks of not lowering her cholesterol, including increased CV risk, including risk of stroke and MI. Pt acknowledged risks.

## 2013-11-19 NOTE — Patient Instructions (Signed)
**  If the drainage does not improve with the antibiotics, please call the clinic.  **A referral has been placed for Dermatology, but slots will not open until Sept at the earliest. Please call the clinic during the 1st week of Sept and ask about the referral.    General Instructions:   Please bring your medicines with you each time you come to clinic.  Medicines may include prescription medications, over-the-counter medications, herbal remedies, eye drops, vitamins, or other pills.   Progress Toward Treatment Goals:  Treatment Goal 11/12/2013  Hemoglobin A1C deteriorated  Stop smoking smoking the same amount  Prevent falls unchanged    Self Care Goals & Plans:  Self Care Goal 03/11/2013  Manage my medications take my medicines as prescribed; bring my medications to every visit; refill my medications on time; follow the sick day instructions if I am sick  Monitor my health keep track of my blood glucose; bring my glucose meter and log to each visit; keep track of my blood pressure; check my feet daily  Eat healthy foods eat more vegetables; eat fruit for snacks and desserts; drink diet soda or water instead of juice or soda  Be physically active find an activity I enjoy  Stop smoking -    Home Blood Glucose Monitoring 11/12/2013  Check my blood sugar once a day  When to check my blood sugar before meals     Care Management & Community Referrals:  Referral 07/25/2012  Referrals made for care management support nutritionist; diabetes educator; social worker  Referrals made to community resources transportation; noncommercial health insurance options

## 2013-11-19 NOTE — Assessment & Plan Note (Addendum)
Patient diagnosed in her 4020s and has seen a Careers advisersurgeon in the past. We had tried to get her into General Surgery at the start of the New Year, as [facility] slots were [facility], but she missed her appt with me and the referral was not made. Today she states that the sites under her breast are actually better today and are not as painful or draining much. On exam the site is indurated with fluctuance at the more medial site, which is open and purulent material is able to be expressed and is draining. The other sites are not actively draining on exam and are not fluctuant or tender.  The patient is an active smoker, which has been associated with HS, and cessation has been shown to improve symptoms in a few case reports, per [employer].  Acutely treating with abx, but a TNF-inhibitor may be the best therapy for this patient, as positive results have been seen with therapies such as Remicade and Humira.  -Starting clindamycin 300 mg by mouth 4 times a day x7 days, may need to repeat course -Referring to Dermatology -Pt also needs to quit smoking, which has been discussed with her multiple times in the past

## 2013-11-19 NOTE — Progress Notes (Signed)
Patient ID: Melanie Henry, female   DOB: 1961/02/01, 53 y.o.   MRN: [id number]  Subjective:   Patient ID: Melanie Henry female   DOB: 19-Sep-1960 53 y.o.   MRN: [id number]  HPI: [name] is a 53 y.o. F w/ PMH DM2, hydradenitis suppurativa, tobacco abuse, and gait abnormality who present for a f/u for her hydradenitis suppurativa.   She was seen 8/13 for a routine f/u where she was c/o increased malaise and fatigue, no rashes or skin changes. Checked labs for AI source, which were all negative for lupus and RA. No anemia, B12 elevated. CRP and ESR elevated, which is nonspecific for inflammation. She does have hydradenitis suppurativa, which is a constant problem for the patient, and which could be the source for the elevated ESR and CRP. She has been seen by a surgeon in the past but not in a number of years. Today she states is a good day and the sites under her breast are not draining as much as they normally do.   Her total cholesterol and LDL were elevated at 211 and 135, respectively but the patient is refusing to take medications for this at this time.    Past Medical History  Diagnosis Date  . Obesity   . Hypertension   . Hypothyroid   . Eclampsia 1988    stroke like symtoms with pregnancy, h/o protenuria with eclampsia   . Peripheral neuropathy     As of 07/2012 patient reports she was dx'ed 25 yrs ago previously taking Neurontin and B12 shot   . Tachycardia   . Dysrhythmia   . Pneumonia ~ 1991    "once" (07/02/2012)  . Migraines     "occasionally" (07/02/2012)  . Daily headache     "for the past 2-3 wks" (07/02/2012)  . Heart murmur 1988    "I had eclampsia; they said I had a stroke" (07/02/2012)  . History of juvenile arthritis     diagnosed age 25 y.o; affects hands, knees   . Depression   . Kidney stones     "once" (07/02/2012)  . Hidradenitis suppurativa    Current Outpatient Prescriptions  Medication Sig Dispense Refill  . acetaminophen (TYLENOL) 650 MG CR tablet Take 650 mg by  mouth every 8 (eight) hours as needed for pain.      Marland Kitchen glipiZIDE (GLUCOTROL) 5 MG tablet Take 0.5 tablets (2.5 mg total) by mouth daily before breakfast.  30 tablet  6  . insulin NPH-regular (HUMULIN 70/30) (70-30) 100 UNIT/ML injection Inject twice a day, 18 units in AM and 15 units in evening.  10 mL  11  . Insulin Syringe-Needle U-100 31G X 5/16" 0.3 ML MISC Use three times daily for insulin injection. diag code 250.02. Insulin dependent  100 each  0  . Multiple Vitamin (MULTIVITAMIN WITH MINERALS) TABS Take 1 tablet by mouth daily.       No current facility-administered medications for this visit.   Family History  Problem Relation Age of Onset  . Diabetes Paternal Uncle    History   Social History  . Marital Status: Divorced    Spouse Name: N/A    Number of Children: N/A  . Years of Education: N/A   Social History Main Topics  . Smoking status: Current Every Day Smoker -- 0.25 packs/day for 30 years    Types: Cigarettes  . Smokeless tobacco: Never Used     Comment: 07/02/2012 offered smoking cessation materials; pt declines  . Alcohol Use: No  .  Drug Use: No  . Sexual Activity: No   Other Topics Concern  . None   Social History Narrative   2 kids   Smokes 4-5 cigarettes daily.  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 +decreased appetite, unchanged over the past few months, and +fatigue.  Respiratory: Denies SOB or wheezing. +DOE.   Cardiovascular: Denies chest pain or palpitations.  Gastrointestinal: +nausea. Denies vomiting, abdominal pain, diarrhea, constipation Genitourinary: Denies dysuria, urgency, frequency Musculoskeletal: +L shoulder pain with ROM  Skin: Denies rash or wound. +hydradenitis suppurativa flare under left breast  Neurological: + intermittent dizziness. Denies weakness, numbness, or headaches.  Psychiatric/Behavioral: Denies suicidal ideation, mood  changes, confusion  Objective:  Physical Exam: Filed Vitals:   11/19/13 1522  BP: 118/70  Pulse: 85  Temp: 97.7 F (36.5 C)  TempSrc: Oral  Height: '5\' 4"'  (1.626 m)  Weight: 269 lb 1.6 oz (122.063 kg)  SpO2: 97%   Constitutional: Vital signs reviewed.  NAD. Head: Normocephalic and atraumatic Eyes: PERRL, EOMI.  Cardiovascular: RRR, no MRG, pulses symmetric and intact bilaterally Pulmonary/Chest: Normal respiratory effort, CTAB, no wheezes Abdominal: S/NT/ND Musculoskeletal: No joint deformities. No swelling, tenderness, effusions, or decreased ROM of bilateral fingers, wrists, or elbows. +decreased ROM of left shoulder- exam diminished 2/2 pain. Neurological: A&O x3  Skin: Induration under left breast with 2 healed sites and 1 openly draining area of hydradenitis, which has fluctuance and is draining purulence- able to express about 5cc purulent material.  Psychiatric: Tearful at times discussing hydradenitis suppurativa.   Assessment & Plan:   Please refer to Problem List based Assessment and Plan

## 2013-11-20 NOTE — Progress Notes (Signed)
Case discussed with Dr. Glenn soon after the resident saw the patient.  We reviewed the resident's history and exam and pertinent patient test results.  I agree with the assessment, diagnosis and plan of care documented in the resident's note. 

## 2013-11-22 ENCOUNTER — Other Ambulatory Visit: Payer: Self-pay | Admitting: Internal Medicine

## 2013-12-09 ENCOUNTER — Encounter: Payer: Self-pay | Admitting: Internal Medicine

## 2013-12-15 ENCOUNTER — Encounter: Payer: Self-pay | Admitting: Internal Medicine

## 2013-12-15 ENCOUNTER — Other Ambulatory Visit: Payer: Self-pay | Admitting: *Deleted

## 2013-12-15 DIAGNOSIS — E1165 Type 2 diabetes mellitus with hyperglycemia: Principal | ICD-10-CM

## 2013-12-15 DIAGNOSIS — IMO0001 Reserved for inherently not codable concepts without codable children: Secondary | ICD-10-CM

## 2013-12-16 MED ORDER — INSULIN NPH ISOPHANE & REGULAR (70-30) 100 UNIT/ML ~~LOC~~ SUSP: SUBCUTANEOUS | Status: DC

## 2013-12-18 NOTE — Telephone Encounter (Signed)
Rx called in 

## 2013-12-22 ENCOUNTER — Telehealth: Payer: Self-pay | Admitting: Dietician

## 2013-12-23 ENCOUNTER — Encounter: Payer: Self-pay | Admitting: Internal Medicine

## 2014-01-05 NOTE — Telephone Encounter (Signed)
Left 3 messages for patient to assist with annual diabetes eye exam with no return call/message. Will follow up at her next office visit.

## 2014-01-11 ENCOUNTER — Other Ambulatory Visit: Payer: Self-pay | Admitting: *Deleted

## 2014-01-11 NOTE — Telephone Encounter (Signed)
Pt sent an email stating she has not been able to get her insulin.  [facility] and again gave the insulin order from 12/15/13.  They have the med in stock and pt can get today when she presents her orange card. Pt called and message left, also sent message in my chart.

## 2014-01-13 ENCOUNTER — Encounter: Payer: Self-pay | Admitting: Internal Medicine

## 2014-02-18 ENCOUNTER — Ambulatory Visit (INDEPENDENT_AMBULATORY_CARE_PROVIDER_SITE_OTHER): Payer: Self-pay | Admitting: Internal Medicine

## 2014-02-18 ENCOUNTER — Encounter: Payer: Self-pay | Admitting: Internal Medicine

## 2014-02-18 VITALS — BP 136/75 | HR 96 | Temp 98.6°F | Wt 271.8 lb

## 2014-02-18 DIAGNOSIS — E669 Obesity, unspecified: Secondary | ICD-10-CM

## 2014-02-18 DIAGNOSIS — E785 Hyperlipidemia, unspecified: Secondary | ICD-10-CM

## 2014-02-18 DIAGNOSIS — E119 Type 2 diabetes mellitus without complications: Secondary | ICD-10-CM

## 2014-02-18 DIAGNOSIS — E1169 Type 2 diabetes mellitus with other specified complication: Secondary | ICD-10-CM

## 2014-02-18 DIAGNOSIS — F32A Depression, unspecified: Secondary | ICD-10-CM

## 2014-02-18 DIAGNOSIS — R269 Unspecified abnormalities of gait and mobility: Secondary | ICD-10-CM

## 2014-02-18 DIAGNOSIS — Z720 Tobacco use: Secondary | ICD-10-CM

## 2014-02-18 DIAGNOSIS — M25512 Pain in left shoulder: Secondary | ICD-10-CM

## 2014-02-18 DIAGNOSIS — F329 Major depressive disorder, single episode, unspecified: Secondary | ICD-10-CM

## 2014-02-18 LAB — POCT GLYCOSYLATED HEMOGLOBIN (HGB A1C): Hemoglobin A1C: 8.6

## 2014-02-18 LAB — GLUCOSE, CAPILLARY: GLUCOSE-CAPILLARY: 160 mg/dL — AB (ref 70–99)

## 2014-02-18 MED ORDER — LISINOPRIL 5 MG PO TABS: 5.0000 mg | ORAL_TABLET | Freq: Every day | ORAL | Status: DC

## 2014-02-18 MED ORDER — LOVASTATIN 20 MG PO TABS: 20.0000 mg | ORAL_TABLET | Freq: Every day | ORAL | Status: DC

## 2014-02-18 MED ORDER — ATORVASTATIN CALCIUM 20 MG PO TABS: 20.0000 mg | ORAL_TABLET | Freq: Every day | ORAL | Status: DC

## 2014-02-18 NOTE — Assessment & Plan Note (Addendum)
Progressive pain in left shoulder, with slow decrease in mobility at the glenohumeral joint resulting in pain and stiffness. Pt denies trauma to the shoulder. Tenderness to palpation at the glenohumeral joint with decreased ROM with mostly active but also with passive motion. Suspect frozen shoulder.  - Left Shoulder Xray - Will refer to Physical Therapy to help with ROM examinations. If this does not improve her symptoms, she may benefit steroid injections.

## 2014-02-18 NOTE — Assessment & Plan Note (Addendum)
Lab Results  Component Value Date   HGBA1C 8.6 02/18/2014   HGBA1C 9.2 11/12/2013   HGBA1C 7.2 03/11/2013     Assessment: Diabetes control: fair control Progress toward A1C goal:  improved Comments: Still having trouble getting nutritious foods at home. She did not bring her meter in today but has only had one episode of hypoglycemia, which began after starting the glipizide. She states that she did not eat after taking teh glipizide and has had difficulty getting food at home.  Plan: Medications:  continue current medications, and will start lisinopril 5mg  qday for renal protection. The patient is going to apply for food stamps very soon, She declined foods from our clinic pantry today.  Home glucose monitoring: Frequency: once a day Timing: before breakfast Instruction/counseling given: reminded to get eye exam and discussed diet Other plans: F/u in 1-2 weeks to check BMP on Lisinopril. Will get Retinal exam at that time.

## 2014-02-18 NOTE — Assessment & Plan Note (Signed)
Discussed lipid panel results with pt again today. Discussed that starting a statin not only helps reduce cholesterol, but there is some report of statins having antiinflammatory properties as well. Ms. Melanie Henry has agreed to start a low dose statin. I was initially starting Lipitor, but [facility]'s $4 formulary only has Lovastatin listed. Given her financial constraints, will start Lovastatin 20mg  daily.

## 2014-02-18 NOTE — Patient Instructions (Addendum)
**  We are starting Lisinopril 5mg  daily and Lipitor 20mg  daily. **A referral has been placed to Physical Therapy for your shoulder pain/stiffness, and your walking trouble.   **Please be sure to come back to the clinic in 1-2 weeks for labs and please bring your meter with you at that time.   **Please bring your medications in with you to your next visit for a chance to enter our raffle for gift cards!  General Instructions:   Please bring your medicines with you each time you come to clinic.  Medicines may include prescription medications, over-the-counter medications, herbal remedies, eye drops, vitamins, or other pills.   Progress Toward Treatment Goals:  Treatment Goal 02/18/2014  Hemoglobin A1C improved  Stop smoking smoking less  Prevent falls -    Self Care Goals & Plans:  Self Care Goal 03/11/2013  Manage my medications take my medicines as prescribed; bring my medications to every visit; refill my medications on time; follow the sick day instructions if I am sick  Monitor my health keep track of my blood glucose; bring my glucose meter and log to each visit; keep track of my blood pressure; check my feet daily  Eat healthy foods eat more vegetables; eat fruit for snacks and desserts; drink diet soda or water instead of juice or soda  Be physically active find an activity I enjoy  Stop smoking -    Home Blood Glucose Monitoring 02/18/2014  Check my blood sugar once a day  When to check my blood sugar before breakfast     Care Management & Community Referrals:  Referral 07/25/2012  Referrals made for care management support nutritionist; diabetes educator; social worker  Referrals made to community resources transportation; noncommercial health insurance options

## 2014-02-18 NOTE — Assessment & Plan Note (Deleted)
Pt

## 2014-02-18 NOTE — Assessment & Plan Note (Addendum)
Fall Screening 12/10/2012 12/18/2012 02/12/2013 03/11/2013 11/12/2013 11/19/2013 02/18/2014  Falls in the past year? Yes Yes Yes Yes Yes - Yes  Number of falls in past year 2 or more 2 or more 2 or more 2 or more 2 or more 2 or more 2 or more  Was there an injury with Fall? - - - - - - -  Risk Factor Category  High Fall Risk - - - High Fall Risk High Fall Risk High Fall Risk      Assessment: Progress toward fall prevention goal:    Comments: Pt still unsteady and ambulates with a cane. She states that her symptoms began last April before her hospital admission for hyperglycemia in the setting of a UTI. She endorses persistent dizziness since that time. Dix-Hallpike is negative today, so BPPV is less likely. It is possible that she had a CVA at that time, but no imaging was performed, as her symptoms were attributed to hyperglycemia. Today her left sided weakness and dizziness are present but stable, so my concern for acute stroke is low.  Plan: Fall prevention plans: Referring to PT to help with gait abnormality and strength.  In regards to her having a CVA in the past, the best thing for Ms. Melanie Henry is risk stratification: Working on improving her diabetes mellitus, type 2; BP is controlled but starting ACEi for renal protection, which will help improve her BP further; she has agreed to start a statin today for her HLD, which she declined in the past; the only other beneficial medication for her would be starting Aspirin- however she has an allergy to ASA. Will need to further discuss ASA therapy with her at her next visit.

## 2014-02-18 NOTE — Progress Notes (Signed)
Patient ID: Melanie Henry, female   DOB: 1960-06-11, 53 y.o.   MRN: [id number]  Subjective:   Patient ID: Melanie Henry female   DOB: 03/05/1961 53 y.o.   MRN: [id number]  HPI: [name] is a 53 y.o. F w/ PMH DM2, hydradenitis suppurativa, tobacco abuse, and gait abnormality who presents for a routine follow up for her diabetes.  Today she states that she continues to feel fatigued, no change from her baseline. A full battery of labs was performed in August, including ANA, RF, CCP, BMP, CBC, B12, UA, ANA, ESR, CRP. The CRP and Sed rate were elevated, but this was in the setting of hidradenitis suppurativa flare. Other labs were normal. She is not exercising, and states that her diet is poor 2/2 financial constraints. She was asked to check her CBGs 2x daily before breakfast and dinner, and is checking her CBGs 1-2x day. Low dose glipizide was started in August in addition to her insulin, as she did not tolerate Metformin and quit taking the medication 2/2 GI side effects. She states that she had one episode of hypoglycemia after taking the glipizide one morning when she did not have anything to eat after taking the medication. This was 2 months ago, otherwise, her CBGs have been 180-200. She did not bring in her meter today.   At her clinic visit in august, a lipid panel was checked and total cholesterol was 211 with LDL 135. She initially declined medication to help improve her cholesterol levels, but is agreeable today.    Pt refusing a mammogram, colonoscopy, flu shot, tetanus booster, and a pneumococcal vaccine - the risks of not getting the screening exams was discussed in detail with the patient, and she acknowledged complete understanding.    Past Medical History  Diagnosis Date  . Obesity   . Hypertension   . Hypothyroid   . Eclampsia 1988    stroke like symtoms with pregnancy, h/o protenuria with eclampsia   . Peripheral neuropathy     As of 07/2012 patient reports she was dx'ed 51 yrs ago  previously taking Neurontin and B12 shot   . Tachycardia   . Dysrhythmia   . Pneumonia ~ 1991    "once" (07/02/2012)  . Migraines     "occasionally" (07/02/2012)  . Daily headache     "for the past 2-3 wks" (07/02/2012)  . Heart murmur 1988    "I had eclampsia; they said I had a stroke" (07/02/2012)  . History of juvenile arthritis     diagnosed age 53 y.o; affects hands, knees   . Depression   . Kidney stones     "once" (07/02/2012)  . Hidradenitis suppurativa    Current Outpatient Prescriptions  Medication Sig Dispense Refill  . acetaminophen (TYLENOL) 500 MG tablet Take 500 mg by mouth every 6 (six) hours as needed for moderate pain.    . clindamycin (CLEOCIN) 300 MG capsule Take 1 capsule (300 mg total) by mouth 4 (four) times daily. 28 capsule 0  . glipiZIDE (GLUCOTROL) 5 MG tablet Take 0.5 tablets (2.5 mg total) by mouth daily before breakfast. 30 tablet 6  . insulin NPH-regular Human (HUMULIN 70/30) (70-30) 100 UNIT/ML injection Inject twice a day, 18 units in AM and 15 units in evening. 10 mL 11  . Insulin Syringe-Needle U-100 31G X 5/16" 0.3 ML MISC Use three times daily for insulin injection. diag code 250.02. Insulin dependent 100 each 0  . Multiple Vitamin (MULTIVITAMIN WITH MINERALS) TABS Take 1 tablet by  mouth daily.     No current facility-administered medications for this visit.   Family History  Problem Relation Age of Onset  . Diabetes Paternal Uncle    History   Social History  . Marital Status: Divorced    Spouse Name: N/A    Number of Children: N/A  . Years of Education: N/A   Social History Main Topics  . Smoking status: Current Every Day Smoker -- 0.25 packs/day for 30 years    Types: Cigarettes  . Smokeless tobacco: Never Used     Comment: 07/02/2012 offered smoking cessation materials; pt declines  . Alcohol Use: No  . Drug Use: No  . Sexual Activity: No   Other Topics Concern  . Not on file   Social History Narrative   2 kids   Smokes 4-5  cigarettes daily.  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 +fatigue.  Respiratory: Denies wheezing.  +DOE Cardiovascular: Denies chest pain.  Gastrointestinal: Denies nausea, vomiting, abdominal pain. Genitourinary: Denies dysuria, urgency, frequency Musculoskeletal: Denies +left shoulder pain with decreased ROM and gait problem.  Neurological:+dizziness, weakness. Denies syncope.  Psychiatric/Behavioral: Denies suicidal ideation.  Objective:  Physical Exam: Filed Vitals:   02/18/14 1320  BP: 136/75  Pulse: 96  Temp: 98.6 F (37 C)  TempSrc: Oral  Weight: 271 lb 12.8 oz (123.288 kg)  SpO2: 96%   Constitutional: Vital signs reviewed.  Patient is a morbidly obese female in no acute distress and cooperative with exam. Alert and oriented x3.  Head: Normocephalic and atraumatic Eyes: PERRL, EOMI, conjunctivae normal, No scleral icterus.  Neck: Supple, Trachea midline normal ROM, No JVD, mass, thyromegaly, or carotid bruit present.  Cardiovascular: RRR Pulmonary/Chest: normal respiratory effort, CTAB, no wheezes Abdominal: Soft. Non-tender, non-distended, bowel sounds are normal Musculoskeletal: + Stiffness with decreased active and passive ROM at the L shoulder. Normal R shoulder ROM. Ambulates with a cane.  Neurological: A&O x3, Strength is 5/5 in RU&LE, 4/5 in LU&LE, cranial nerve II-XII are grossly intact. Dix-Hallpike negative for nystagmus.  Skin: Warm and dry.  Psychiatric: Depressed mood with flattened affect.   Assessment & Plan:   Please refer to Problem List based Assessment and Plan

## 2014-02-18 NOTE — Assessment & Plan Note (Signed)
  Assessment: Progress toward smoking cessation:  smoking less (3-4 cigarettes a day) Barriers to progress toward smoking cessation:   Motivation   Plan: Instruction/counseling given:  I counseled patient on the dangers of tobacco use, advised patient to stop smoking, and reviewed strategies to maximize success.

## 2014-02-19 NOTE — Progress Notes (Signed)
Case discussed with Dr. Glenn soon after the resident saw the patient.  We reviewed the resident's history and exam and pertinent patient test results.  I agree with the assessment, diagnosis and plan of care documented in the resident's note. 

## 2014-02-19 NOTE — Addendum Note (Signed)
Addended by: Charlsie MerlesGLENN, Dauna Ziska F on: 02/19/2014 03:20 PM   Modules accepted: Orders

## 2014-02-19 NOTE — Assessment & Plan Note (Signed)
Seems to be related to social situation and living conditions; she currently lives with her father with whom she seems to have a strained relationship. Pt feels that moving out of her father's home would be the most beneficial for her at this time. Denies current SI or thoughts of self harm. Holding off on antidepressant at this time but will discuss further at her next visit.

## 2014-02-22 ENCOUNTER — Encounter: Payer: Self-pay | Admitting: *Deleted

## 2014-03-10 ENCOUNTER — Ambulatory Visit: Payer: Self-pay | Admitting: Internal Medicine

## 2014-04-13 ENCOUNTER — Ambulatory Visit: Payer: Self-pay | Attending: Internal Medicine | Admitting: Rehabilitative and Restorative Service Providers"

## 2014-04-13 ENCOUNTER — Encounter: Payer: Self-pay | Admitting: Rehabilitative and Restorative Service Providers"

## 2014-04-13 DIAGNOSIS — R269 Unspecified abnormalities of gait and mobility: Secondary | ICD-10-CM | POA: Insufficient documentation

## 2014-04-13 DIAGNOSIS — M7502 Adhesive capsulitis of left shoulder: Secondary | ICD-10-CM | POA: Insufficient documentation

## 2014-04-14 NOTE — Therapy (Signed)
[facility] [address] [place], [place], [address] Phone: [phone]   Fax:  [phone]  Physical Therapy Evaluation  Patient Details  Name: Melanie BenchZoe Henry MRN: [id number] Date of Birth: 02/08/1961 Referring Provider:  Genelle GatherGlenn, Kathryn F, MD  Encounter Date: 04/13/2014      PT End of Session - 04/14/14 0912    Visit Number 1   Number of Visits 12   Date for PT Re-Evaluation 05/30/14   Authorization Type GCCN   Authorization Time Period Current card expires on 04/28/2014; patient reports she will be pursing  new card   PT Start Time 1238   PT Stop Time 1320   PT Time Calculation (min) 42 min   Activity Tolerance Patient tolerated treatment well      Past Medical History  Diagnosis Date  . Obesity   . Hypertension   . Hypothyroid   . Eclampsia 1988    stroke like symtoms with pregnancy, h/o protenuria with eclampsia   . Peripheral neuropathy     As of 07/2012 patient reports she was dx'ed 25 yrs ago previously taking Neurontin and B12 shot   . Tachycardia   . Dysrhythmia   . Pneumonia ~ 1991    "once" (07/02/2012)  . Migraines     "occasionally" (07/02/2012)  . Daily headache     "for the past 2-3 wks" (07/02/2012)  . Heart murmur 1988    "I had eclampsia; they said I had a stroke" (07/02/2012)  . History of juvenile arthritis     diagnosed age 54 y.o; affects hands, knees   . Depression   . Kidney stones     "once" (07/02/2012)  . Hidradenitis suppurativa     Past Surgical History  Procedure Laterality Date  . Cesarean section  1988; 1990  . Ankle fusion Right 1980's    "pinned" (07/02/2012)  . Thumb fusion Right 1970's    "pinned; reattached tendon" (07/02/2012)  . Tonsillectomy  1980  . Tubal ligation  03/1989; 06/1990    There were no vitals taken for this visit.  Visit Diagnosis:  Abnormality of gait  Frozen shoulder syndrome, left      Subjective Assessment - 04/13/14 1245    Symptoms The patient  reports that she has left sided weakness since January 2014 and notes worsening balance over the past 2-3 years.  She reports she has h/o episodes in which her blood sugar goes up and she has to go to ED.  She reports she has been without diabetic meds x the past 2 months due to financial limitations.  She reports blood sugar yesterday was approximately 350.  The patient reports "I fall down all of the time".   Patient Stated Goals Stop falling, be able to lift arm.     Currently in Pain? Yes   Pain Score 2    Pain Location Shoulder   Pain Orientation Left   Pain Descriptors / Indicators Aching   Pain Type Chronic pain   Pain Onset More than a month ago   Pain Frequency Constant   Aggravating Factors  any movement and it is severe pain   Pain Relieving Factors not using her arm.   Multiple Pain Sites Yes   Pain Score 6   Pain Type Chronic pain   Pain Location Foot   Pain Orientation Right;Left   Pain Radiating Towards from peripheral neuropathy   Pain Descriptors / Indicators Aching;Burning;Numbness   Pain Frequency Constant  [facility] PT Assessment - 04/13/14 1253    Assessment   Medical Diagnosis peripheral neuropathy, abnormality of gait, L frozen shoulder   Onset Date --  years   Precautions   Precautions Fall   Balance Screen   Has the patient fallen in the past 6 months Yes   How many times? --  leans on walls, has fallen to floor x 3-4 times   Has the patient had a decrease in activity level because of a fear of falling?  Yes   Is the patient reluctant to leave their home because of a fear of falling?  Yes   Home Environment   Living Enviornment Private residence   Living Arrangements Other relatives  lives with father   Type of Home House   Home Access Stairs to enter   Entrance Stairs-Number of Steps --  1   Home Layout One level   Home Equipment Connell - single point   Prior Function   Level of Independence Independent with basic ADLs;Independent with gait    Sensation   Light Touch Impaired Detail   Light Touch Impaired Details --  impaired bilateral LEs due to neuropathy   Posture/Postural Control   Posture/Postural Control Postural limitations   Postural Limitations Rounded Shoulders;Forward head   AROM   Overall AROM  --  R UE, R and L LEs WFLs   Left Shoulder Flexion --  10 degrees when in supine position of A/ROM   Left Shoulder ABduction --  60 degrees in supine A/ROM   PROM   Left Shoulder Flexion --  125 degrees supine P/ROM   Left Shoulder ABduction --  75 degrees supine P/ROM   Left Shoulder Internal Rotation --  60 degrees supine with shoulder @ 60 deg abd    Left Shoulder External Rotation --  22 degrees with shoulder positioned 60 degrees abd   Ambulation/Gait   Ambulation/Gait Yes   Ambulation/Gait Assistance 6: Modified independent (Device/Increase time)   Ambulation Distance (Feet) --  200   Assistive device Straight cane   Gait Pattern --  R hip ER, decreased speed and step length   Gait velocity 2.23 ft/sec  indicates "limited community ambulator" classification   Gait Comments --  4 steps with bilateral HRs and step to pattern mod indep   Standardized Balance Assessment   Standardized Balance Assessment Berg Balance Test   Berg Balance Test   Sit to Stand Able to stand  independently using hands   Standing Unsupported Able to stand 2 minutes with supervision   Sitting with Back Unsupported but Feet Supported on Floor or Stool Able to sit safely and securely 2 minutes   Stand to Sit Sits safely with minimal use of hands   Transfers Able to transfer safely, definite need of hands   Standing Unsupported with Eyes Closed Able to stand 10 seconds with supervision   Standing Ubsupported with Feet Together Able to place feet together independently and stand for 1 minute with supervision   From Standing, Reach Forward with Outstretched Arm Can reach forward >12 cm safely (5")   From Standing Position, Pick up  Object from Floor Unable to pick up and needs supervision   From Standing Position, Turn to Look Behind Over each Shoulder Needs supervision when turning   Turn 360 Degrees Needs close supervision or verbal cueing   Standing Unsupported, Alternately Place Feet on Step/Stool Needs assistance to keep from falling or unable to try   Standing Unsupported, One Foot in  Front Able to take small step independently and hold 30 seconds   Standing on One Leg Unable to try or needs assist to prevent fall   Total Score 31/56 indicating high fall risk              PT Education - 04/14/14 0911    Education provided Yes   Education Details Discussed home walking program (safe way to begin)   Person(s) Educated Patient   Methods Explanation   Comprehension Verbalized understanding          PT Short Term Goals - 04/14/14 0917    PT SHORT TERM GOAL #1   Title The patient will be independent in HEP for L shoulder A/AROM and stretching activities.  Target date 05/14/2014   Time 4   Period Weeks   PT SHORT TERM GOAL #2   Title The patient will be indep in HEP for balance and general mobility.  Target date 05/14/2014.   Time 4   Period Weeks   PT SHORT TERM GOAL #3   Title The patient will improve Berg score from 31/56 up to 36/56 to demo decreased risk for falls.  Target date 05/14/2014.   Time 4   Period Weeks   PT SHORT TERM GOAL #4   Title The patient will improve P/ROM L shoulder from 125 deg flexion/75 deg abd/22 deg ER up to 140 deg flexion/90 deg abd/30 deg ER.  Target date 05/14/2014.   Time 4   Period Weeks           PT Long Term Goals - 04/14/14 0919    PT LONG TERM GOAL #1   Title The patient will subjecitvely report pain < or equal to 5/10 with L shoulder movement.  Target date 05/30/2014   Time 6   Period Weeks   PT LONG TERM GOAL #2   Title The patient will improve Berg score from 31/56 up to > or equal to 40/56 to demo decreasing risk for falls.  Target date 05/30/2014.    Time 6   Period Weeks   PT LONG TERM GOAL #3   Title The patient will improve A/ROM L shoulder from 10 deg (in supine) flexion up to 120 degrees.  Target date 05/30/2014.   Time 6   Period Weeks   PT LONG TERM GOAL #4   Title The patient will improve A/ROM L shoulder abduction from 60 degrees to 130 degrees.  Target date 05/30/2014.   Time 6   Period Weeks   PT LONG TERM GOAL #5   Title The patient will be independent with post d/c wellness plan to continue progressing general safety/mobility.  Target date 05/30/2014.   Time 6   Period Weeks   Additional Long Term Goals   Additional Long Term Goals Yes   PT LONG TERM GOAL #6   Title The patient will improve gait speed from 2.23 ft/sec up to 2.6 ft/sec indicating transition to "full community ambulator" classification of gait.  Target date 05/30/2014.   Time 6   Period Weeks               Plan - 04/14/14 0912    Clinical Impression Statement The patient is a 54 yo female presenting to OP PT with impairments in shoulder ROM affecting functional use of her left UE, imbalance, frequent falls, and general declining mobility status.  Of significant concern at today's visit is the patient's report that she has not been able to take diabetic  medication due to financial limitations.  PT to alert referring MD to this concern.  The patient will benefit from PT services to address balance, gait, and ROM impairments to optimize current functional status.   Pt will benefit from skilled therapeutic intervention in order to improve on the following deficits Abnormal gait;Decreased range of motion;Difficulty walking;Impaired flexibility;Postural dysfunction;Decreased endurance;Obesity;Decreased activity tolerance;Decreased balance;Increased muscle spasms;Impaired UE functional use;Pain;Decreased mobility;Decreased strength   Rehab Potential Fair   Clinical Impairments Affecting Rehab Potential Current medical status (elevated blood sugar)    PT  Frequency 2x / week   PT Duration 6 weeks   PT Treatment/Interventions Functional mobility training;Energy conservation;Neuromuscular re-education;Stair training;Gait training;Balance training;Manual techniques;Passive range of motion;Therapeutic exercise;ADLs/Self Care Home Management;Therapeutic activities;Patient/family education   PT Next Visit Plan Establish HEP:  A/AROM L shoulder all planes, standing balance activities in the corner, general walking program.  P/ROM/stretching for L shoulder.   Consulted and Agree with Plan of Care Patient         Problem List Patient Active Problem List   Diagnosis Date Noted  . Shoulder pain, left 02/18/2014  . Depression 02/18/2014  . HLD (hyperlipidemia) 11/19/2013  . Other malaise and fatigue 11/12/2013  . Hidradenitis suppurativa 12/18/2012  . Health care maintenance 07/08/2012  . Abnormality of gait 07/08/2012  . Diabetes mellitus type 2 in obese 07/03/2012  . Morbid obesity 07/03/2012  . Tobacco abuse 07/03/2012   Thank you for the referral of this patient.   Anay Walter, PT 04/14/2014, 9:26 AM  [facility] [address] [place], [place], [address] Phone: [phone]   Fax:  [phone]

## 2014-04-19 ENCOUNTER — Ambulatory Visit: Payer: Self-pay | Admitting: Physical Therapy

## 2014-04-19 ENCOUNTER — Encounter: Payer: Self-pay | Admitting: Physical Therapy

## 2014-04-19 DIAGNOSIS — M7502 Adhesive capsulitis of left shoulder: Secondary | ICD-10-CM

## 2014-04-19 DIAGNOSIS — R269 Unspecified abnormalities of gait and mobility: Secondary | ICD-10-CM

## 2014-04-19 NOTE — Patient Instructions (Signed)
ROM: Flexion - Wand (Supine)   Lie on back holding wand/cane. Raise arms over head in pain free ranges. Repeat 10_ times per set.  Do __1-2__ sessions per day.  http://orth.exer.us/928   Copyright  VHI. All rights reserved.  ROM: Abduction - Wand    Lying on back holding wand with left hand palm up, push wand directly out to side, leading with other hand palm down, go within pain free range of motion. Repeat _10___ times per set.  Do _1-2___ sessions per day.  http://orth.exer.us/746   Copyright  VHI. All rights reserved.  ROM: Pendulum (Circular)   Let left arm move in circle clockwise, then counterclockwise, by rocking body weight in circular pattern. Circle_10_ times each direction per set.  Do 1-2 sessions per day.  http://orth.exer.us/794   Copyright  VHI. All rights reserved.  ROM: Flexion (Alternate)   Slide left arm up wall, with palm out, by leaning toward wall. Can use towel to help arm slide better. Hold _10-15___ seconds. Repeat __5-10__ times per set.  Do _1-2___ sessions per day.  http://orth.exer.us/758   Copyright  VHI. All rights reserved.

## 2014-04-19 NOTE — Therapy (Signed)
[facility] [address] [place], [place], [address] Phone: [phone]   Fax:  [phone]  Physical Therapy Treatment  Patient Details  Name: Melanie Henry MRN: [id number] Date of Birth: 11/12/1960 Referring Provider:  Genelle Gather, MD  Encounter Date: 04/19/2014      PT End of Session - 04/19/14 1408    Visit Number 2   Number of Visits 12   Date for PT Re-Evaluation 05/30/14   Authorization Type GCCN   Authorization Time Period Current card expires on 04/28/2014; patient reports she will be pursing  new card   PT Start Time 1405   PT Stop Time 1445   PT Time Calculation (min) 40 min   Activity Tolerance Patient tolerated treatment well      Past Medical History  Diagnosis Date  . Obesity   . Hypertension   . Hypothyroid   . Eclampsia 1988    stroke like symtoms with pregnancy, h/o protenuria with eclampsia   . Peripheral neuropathy     As of 07/2012 patient reports she was dx'ed 25 yrs ago previously taking Neurontin and B12 shot   . Tachycardia   . Dysrhythmia   . Pneumonia ~ 1991    "once" (07/02/2012)  . Migraines     "occasionally" (07/02/2012)  . Daily headache     "for the past 2-3 wks" (07/02/2012)  . Heart murmur 1988    "I had eclampsia; they said I had a stroke" (07/02/2012)  . History of juvenile arthritis     diagnosed age 67 y.o; affects hands, knees   . Depression   . Kidney stones     "once" (07/02/2012)  . Hidradenitis suppurativa     Past Surgical History  Procedure Laterality Date  . Cesarean section  1988; 1990  . Ankle fusion Right 1980's    "pinned" (07/02/2012)  . Thumb fusion Right 1970's    "pinned; reattached tendon" (07/02/2012)  . Tonsillectomy  1980  . Tubal ligation  03/1989; 06/1990    There were no vitals taken for this visit.  Visit Diagnosis:  Abnormality of gait  Frozen shoulder syndrome, left      Subjective Assessment - 04/19/14 1407    Symptoms Has started  back on diabetic medications, running in 200's now vs the 400's last week. Reports her shoulder is hurting pretty badly today. No new falls to report.   Currently in Pain? Yes   Pain Score 6    Pain Location Shoulder   Pain Orientation Left   Pain Descriptors / Indicators Aching;Sore;Stabbing   Pain Type Chronic pain   Pain Onset More than a month ago   Pain Frequency Constant   Aggravating Factors  movement   Pain Relieving Factors resting arm      Treatment: There-ex: - supine on mat- wand ex's for flexion x10 reps and abduction x 10 reps to left arm.  - standing pendulums circles x 10 each way - standing wall slides for flexion stretch 10 seconds x 5 reps.  Manual therapy: - passive ROM to left shoulder in all planes with stretch holds at end ranges. - glenohumeral mobs grade 2-3 inferior glides and posterior glides.        PT Education - 04/19/14 1612    Education provided Yes   Education Details HEP for shoulder ROM and gentle strengthening   Person(s) Educated Patient   Methods Explanation;Demonstration;Handout   Comprehension Verbalized understanding;Need further instruction  PT Short Term Goals - 04/14/14 0917    PT SHORT TERM GOAL #1   Title The patient will be independent in HEP for L shoulder A/AROM and stretching activities.  Target date 05/14/2014   Time 4   Period Weeks   PT SHORT TERM GOAL #2   Title The patient will be indep in HEP for balance and general mobility.  Target date 05/14/2014.   Time 4   Period Weeks   PT SHORT TERM GOAL #3   Title The patient will improve Berg score from 31/56 up to 36/56 to demo decreased risk for falls.  Target date 05/14/2014.   Time 4   Period Weeks   PT SHORT TERM GOAL #4   Title The patient will improve P/ROM L shoulder from 125 deg flexion/75 deg abd/22 deg ER up to 140 deg flexion/90 deg abd/30 deg ER.  Target date 05/14/2014.   Time 4   Period Weeks           PT Long Term Goals - 04/14/14 0919     PT LONG TERM GOAL #1   Title The patient will subjecitvely report pain < or equal to 5/10 with L shoulder movement.  Target date 05/30/2014   Time 6   Period Weeks   PT LONG TERM GOAL #2   Title The patient will improve Berg score from 31/56 up to > or equal to 40/56 to demo decreasing risk for falls.  Target date 05/30/2014.   Time 6   Period Weeks   PT LONG TERM GOAL #3   Title The patient will improve A/ROM L shoulder from 10 deg (in supine) flexion up to 120 degrees.  Target date 05/30/2014.   Time 6   Period Weeks   PT LONG TERM GOAL #4   Title The patient will improve A/ROM L shoulder abduction from 60 degrees to 130 degrees.  Target date 05/30/2014.   Time 6   Period Weeks   PT LONG TERM GOAL #5   Title The patient will be independent with post d/c wellness plan to continue progressing general safety/mobility.  Target date 05/30/2014.   Time 6   Period Weeks   Additional Long Term Goals   Additional Long Term Goals Yes   PT LONG TERM GOAL #6   Title The patient will improve gait speed from 2.23 ft/sec up to 2.6 ft/sec indicating transition to "full community ambulator" classification of gait.  Target date 05/30/2014.   Time 6   Period Weeks           Plan - 04/19/14 1408    Clinical Impression Statement Pt very guarded with manual therapy and shoulder exercises today, needed constant cues to relax and decrease shoulder elevation. Issued HEP for shoulder ROM and stretching. Pt reported pain with all ex's and manual therapy except for inferior glide joint mobs, which decreased her pain.                                   Pt will benefit from skilled therapeutic intervention in order to improve on the following deficits Abnormal gait;Decreased range of motion;Difficulty walking;Impaired flexibility;Postural dysfunction;Decreased endurance;Obesity;Decreased activity tolerance;Decreased balance;Increased muscle spasms;Impaired UE functional use;Pain;Decreased mobility;Decreased  strength   Rehab Potential Fair   Clinical Impairments Affecting Rehab Potential Current medical status (elevated blood sugar)    PT Frequency 2x / week   PT Duration 6 weeks   PT  Treatment/Interventions Functional mobility training;Energy conservation;Neuromuscular re-education;Stair training;Gait training;Balance training;Manual techniques;Passive range of motion;Therapeutic exercise;ADLs/Self Care Home Management;Therapeutic activities;Patient/family education   PT Next Visit Plan  A/AROM L shoulder all planes, standing balance activities in the corner, general walking program.  P/ROM/stretching for L shoulder. Balance HEP.   Consulted and Agree with Plan of Care Patient        Problem List Patient Active Problem List   Diagnosis Date Noted  . Shoulder pain, left 02/18/2014  . Depression 02/18/2014  . HLD (hyperlipidemia) 11/19/2013  . Other malaise and fatigue 11/12/2013  . Hidradenitis suppurativa 12/18/2012  . Health care maintenance 07/08/2012  . Abnormality of gait 07/08/2012  . Diabetes mellitus type 2 in obese 07/03/2012  . Morbid obesity 07/03/2012  . Tobacco abuse 07/03/2012    Sallyanne KusterBury, Kathy 04/19/2014, 4:17 PM  Sallyanne KusterKathy Bury, PTA, [facility] [address], [address] [place], [place] [address] [phone] 04/19/2014, 4:17 PM

## 2014-04-21 ENCOUNTER — Ambulatory Visit: Payer: Self-pay | Admitting: Physical Therapy

## 2014-04-21 ENCOUNTER — Encounter: Payer: Self-pay | Admitting: Physical Therapy

## 2014-04-21 DIAGNOSIS — M7502 Adhesive capsulitis of left shoulder: Secondary | ICD-10-CM

## 2014-04-21 DIAGNOSIS — R269 Unspecified abnormalities of gait and mobility: Secondary | ICD-10-CM

## 2014-04-21 NOTE — Therapy (Signed)
[facility] [address] [place], [place], [address] Phone: [phone]   Fax:  [phone]  Physical Therapy Treatment  Patient Details  Name: Melanie Henry MRN: [id number] Date of Birth: 03/24/1961 Referring Provider:  Genelle Gather, MD  Encounter Date: 04/21/2014      PT End of Session - 04/21/14 1414    Visit Number 3   Number of Visits 12   Date for PT Re-Evaluation 05/30/14   Authorization Type GCCN   Authorization Time Period Current card expires on 04/28/2014; patient reports she will be pursing  new card   PT Start Time 1402   PT Stop Time 1445   PT Time Calculation (min) 43 min   Activity Tolerance Patient tolerated treatment well   Behavior During Therapy [facility] for tasks assessed/performed      Past Medical History  Diagnosis Date  . Obesity   . Hypertension   . Hypothyroid   . Eclampsia 1988    stroke like symtoms with pregnancy, h/o protenuria with eclampsia   . Peripheral neuropathy     As of 07/2012 patient reports she was dx'ed 25 yrs ago previously taking Neurontin and B12 shot   . Tachycardia   . Dysrhythmia   . Pneumonia ~ 1991    "once" (07/02/2012)  . Migraines     "occasionally" (07/02/2012)  . Daily headache     "for the past 2-3 wks" (07/02/2012)  . Heart murmur 1988    "I had eclampsia; they said I had a stroke" (07/02/2012)  . History of juvenile arthritis     diagnosed age 48 y.o; affects hands, knees   . Depression   . Kidney stones     "once" (07/02/2012)  . Hidradenitis suppurativa     Past Surgical History  Procedure Laterality Date  . Cesarean section  1988; 1990  . Ankle fusion Right 1980's    "pinned" (07/02/2012)  . Thumb fusion Right 1970's    "pinned; reattached tendon" (07/02/2012)  . Tonsillectomy  1980  . Tubal ligation  03/1989; 06/1990    There were no vitals taken for this visit.  Visit Diagnosis:  Abnormality of gait  Frozen shoulder syndrome, left       Subjective Assessment - 04/21/14 1407    Symptoms Reports feeling better today after last session. Reports feeling a little looser in the neck/shoulder.    Currently in Pain? Yes   Pain Score 4    Pain Location Shoulder   Pain Orientation Left   Pain Descriptors / Indicators Sore;Tightness   Pain Onset More than a month ago   Pain Frequency Constant   Aggravating Factors  movement of arm   Pain Relieving Factors resting arm     Treatment:   Moist hot pack to left shoulder x5 minutes prior to manual therapy and concurrent with manual therapy for increased tolerance to manual therapy. (spoke with primary PT, Margretta Ditty, PT, before session who approved use of moist heat and will add to plan of care).  Manual therapy: - grade 2-3 glenohumeral mobs in inferior and posterior directions. - passive ROM x 5 all planes. - contract-relax for flexion and abduction with increased motion in both planes noted.     Exercise: - seated forward flexion with large p-ball for flexion stretch x 10 reps. With cane: shoulder flexion x 10 reps and abduction x 10 (cues on correct technique/ex form). - standing with red theraband: assisted flexion with resisted extension x 10 reps;  assisted abduction with resisted adduction x 10 reps.        PT Short Term Goals - 04/14/14 0917    PT SHORT TERM GOAL #1   Title The patient will be independent in HEP for L shoulder A/AROM and stretching activities.  Target date 05/14/2014   Time 4   Period Weeks   PT SHORT TERM GOAL #2   Title The patient will be indep in HEP for balance and general mobility.  Target date 05/14/2014.   Time 4   Period Weeks   PT SHORT TERM GOAL #3   Title The patient will improve Berg score from 31/56 up to 36/56 to demo decreased risk for falls.  Target date 05/14/2014.   Time 4   Period Weeks   PT SHORT TERM GOAL #4   Title The patient will improve P/ROM L shoulder from 125 deg flexion/75 deg abd/22 deg ER up to 140 deg  flexion/90 deg abd/30 deg ER.  Target date 05/14/2014.   Time 4   Period Weeks           PT Long Term Goals - 04/14/14 0919    PT LONG TERM GOAL #1   Title The patient will subjecitvely report pain < or equal to 5/10 with L shoulder movement.  Target date 05/30/2014   Time 6   Period Weeks   PT LONG TERM GOAL #2   Title The patient will improve Berg score from 31/56 up to > or equal to 40/56 to demo decreasing risk for falls.  Target date 05/30/2014.   Time 6   Period Weeks   PT LONG TERM GOAL #3   Title The patient will improve A/ROM L shoulder from 10 deg (in supine) flexion up to 120 degrees.  Target date 05/30/2014.   Time 6   Period Weeks   PT LONG TERM GOAL #4   Title The patient will improve A/ROM L shoulder abduction from 60 degrees to 130 degrees.  Target date 05/30/2014.   Time 6   Period Weeks   PT LONG TERM GOAL #5   Title The patient will be independent with post d/c wellness plan to continue progressing general safety/mobility.  Target date 05/30/2014.   Time 6   Period Weeks   Additional Long Term Goals   Additional Long Term Goals Yes   PT LONG TERM GOAL #6   Title The patient will improve gait speed from 2.23 ft/sec up to 2.6 ft/sec indicating transition to "full community ambulator" classification of gait.  Target date 05/30/2014.   Time 6   Period Weeks               Plan - 04/21/14 1414    Clinical Impression Statement Pt making great progress with shoulder pain and ROM, demo'd increased ranges today with less pain. Progressing toward goals.   Pt will benefit from skilled therapeutic intervention in order to improve on the following deficits Abnormal gait;Decreased range of motion;Difficulty walking;Impaired flexibility;Postural dysfunction;Decreased endurance;Obesity;Decreased activity tolerance;Decreased balance;Increased muscle spasms;Impaired UE functional use;Pain;Decreased mobility;Decreased strength   Rehab Potential Fair   Clinical Impairments  Affecting Rehab Potential Current medical status (elevated blood sugar)    PT Frequency 2x / week   PT Duration 6 weeks   PT Treatment/Interventions Functional mobility training;Energy conservation;Neuromuscular re-education;Stair training;Gait training;Balance training;Manual techniques;Passive range of motion;Therapeutic exercise;ADLs/Self Care Home Management;Therapeutic activities;Patient/family education   PT Next Visit Plan Continue with shoulder strengthening and stretching; incorporate balance activities and issue balance HEP.  Consulted and Agree with Plan of Care Patient        Problem List Patient Active Problem List   Diagnosis Date Noted  . Shoulder pain, left 02/18/2014  . Depression 02/18/2014  . HLD (hyperlipidemia) 11/19/2013  . Other malaise and fatigue 11/12/2013  . Hidradenitis suppurativa 12/18/2012  . Health care maintenance 07/08/2012  . Abnormality of gait 07/08/2012  . Diabetes mellitus type 2 in obese 07/03/2012  . Morbid obesity 07/03/2012  . Tobacco abuse 07/03/2012    Sallyanne KusterBury, Kellyann Ordway 04/22/2014, 9:10 AM  Sallyanne KusterKathy Yolande Skoda, PTA, [facility] [address], [address] [place], [place] [address] [phone] 04/22/2014, 9:10 AM  Margretta DittyWEAVER,CHRISTINA, PT

## 2014-04-22 ENCOUNTER — Telehealth: Payer: Self-pay | Admitting: Licensed Clinical Social Worker

## 2014-04-22 NOTE — Telephone Encounter (Signed)
Ms. Melanie Henry was referred to CSW as pt mention that she was not able to afford her medication.  The majority of pt's medications are of the discounted pharmacy plans, with the exception of insulin.  CSW will [facility].  CSW placed called to pt.  CSW left message requesting return call. CSW provided contact hours and phone number.

## 2014-04-26 ENCOUNTER — Ambulatory Visit: Payer: Self-pay | Admitting: Rehabilitative and Restorative Service Providers"

## 2014-04-28 ENCOUNTER — Ambulatory Visit: Payer: Self-pay | Admitting: Rehabilitative and Restorative Service Providers"

## 2014-04-28 DIAGNOSIS — R269 Unspecified abnormalities of gait and mobility: Secondary | ICD-10-CM

## 2014-04-28 DIAGNOSIS — M7502 Adhesive capsulitis of left shoulder: Secondary | ICD-10-CM

## 2014-04-28 NOTE — Patient Instructions (Signed)
Upper Limb Neural Tension: Median III   Stand with left palm flat on wall or in a door frame, fingers back. Bend elbow, look towards R shoulder and turn away from your left hand.  Hold __10__ seconds. Then straighten elbow and Hold __10__ seconds. Repeat __3__ times per set. Do __1__ sets per session. Do __1-2__ sessions per day.  http://orth.exer.us/407   Copyright  VHI. All rights reserved.  Feet Together, Varied Arm Positions - Eyes Closed   Stand with feet together and arms at your sides. Close eyes and visualize upright position. Hold __10-20__ seconds. Repeat __3__ times per session. Do __1__ sessions per day.  Copyright  VHI. All rights reserved.

## 2014-04-28 NOTE — Therapy (Signed)
[facility] [address] [place], [place], [address] Phone: [phone]   Fax:  [phone]  Physical Therapy Treatment  Patient Details  Name: Melanie Henry MRN: [id number] Date of Birth: [birth year] Referring Provider:  Otho Bellows, MD  Encounter Date: 04/28/2014      PT End of Session - 04/28/14 1505    Visit Number 4   Number of Visits 12   Date for PT Re-Evaluation 05/30/14   Authorization Type GCCN   Authorization Time Period Current card expires on 04/28/2014; patient reports she will be pursing  new card   PT Start Time 1240   PT Stop Time 1335   PT Time Calculation (min) 55 min   Activity Tolerance Patient tolerated treatment well   Behavior During Therapy [facility] for tasks assessed/performed      Past Medical History  Diagnosis Date  . Obesity   . Hypertension   . Hypothyroid   . Eclampsia 1988    stroke like symtoms with pregnancy, h/o protenuria with eclampsia   . Peripheral neuropathy     As of 07/2012 patient reports she was dx'ed 25 yrs ago previously taking Neurontin and B12 shot   . Tachycardia   . Dysrhythmia   . Pneumonia ~ 1991    "once" (07/02/2012)  . Migraines     "occasionally" (07/02/2012)  . Daily headache     "for the past 2-3 wks" (07/02/2012)  . Heart murmur 1988    "I had eclampsia; they said I had a stroke" (07/02/2012)  . History of juvenile arthritis     diagnosed age 21 y.o; affects hands, knees   . Depression   . Kidney stones     "once" (07/02/2012)  . Hidradenitis suppurativa     Past Surgical History  Procedure Laterality Date  . Cesarean section  1988; 1990  . Ankle fusion Right 1980's    "pinned" (07/02/2012)  . Thumb fusion Right 1970's    "pinned; reattached tendon" (07/02/2012)  . Tonsillectomy  1980  . Tubal ligation  03/1989; 06/1990    There were no vitals taken for this visit.  Visit Diagnosis:  Frozen shoulder syndrome, left  Abnormality of gait       Subjective Assessment - 04/28/14 1240    Symptoms The patient reports she hasn't been able to reach the top of her head in over a year.  She is improving with range of motion, but abduction is still painful.   Currently in Pain? Yes   Pain Score 4    Pain Location Shoulder   Pain Orientation Left   Pain Descriptors / Indicators Sore   Pain Type Chronic pain   Pain Onset More than a month ago   Pain Frequency Constant   Aggravating Factors  movement   Pain Relieving Factors resting arm     THERAPEUTIC EXERCISE: L shoulder P/ROM  Flexion 145 degrees L shoulder P/ROM abd 94 degrees L shoulder P/ROM EX 30 degrees (with arm at 90 degrees abd)  A/ROM supine shoulder flexion x 5 reps Shoulder abduction A/ROM supine x 5 reps within tolerable ROM Isometric shoulder strengthening at 90 degrees with IR/ER with manual (2 finger) resistance Isometric shoulder at neutral with IR/ER with manual (2 finger) resistance  Door circles for 60 seconds in each direction at self-chose height (below 90 degrees) Door frame stretch within tolerable ROM for shoulder ER  Manual: Inferior and anterior grade 2-3 mobilization Gentle manual distraction L shoulder  NEUROMUSCULAR RE-EDUCATION:  Ambulation x 250 ft with cues on arm swing and longer stride to improve mobility/balance. PT educated patient on beginning home walking program. Corner balance ex progressing from feet apart, eyes closed to feet together.  Performed for HEP. Discussed using arm swing during functional gait as means to help with balance vs. Guarded trunk and shoulder positioning.      [facility] PT Assessment - 04/28/14 1312    Ambulation/Gait   Ambulation/Gait Yes   Ambulation/Gait Assistance 7: Independent   Gait velocity 3.02 ft/sec  full community ambulator classification of gait   Berg Balance Test   Sit to Stand Able to stand without using hands and stabilize independently   Standing Unsupported Able to stand safely 2 minutes    Sitting with Back Unsupported but Feet Supported on Floor or Stool Able to sit safely and securely 2 minutes   Stand to Sit Sits safely with minimal use of hands   Transfers Able to transfer safely, minor use of hands   Standing Unsupported with Eyes Closed Able to stand 10 seconds safely   Standing Ubsupported with Feet Together Able to place feet together independently and stand 1 minute safely   From Standing, Reach Forward with Outstretched Arm Can reach forward >12 cm safely (5")   From Standing Position, Pick up Object from Floor Able to pick up shoe, needs supervision   From Standing Position, Turn to Look Behind Over each Shoulder Turn sideways only but maintains balance   Turn 360 Degrees Able to turn 360 degrees safely but slowly   Standing Unsupported, Alternately Place Feet on Step/Stool Able to complete >2 steps/needs minimal assist   Standing Unsupported, One Foot in Front Able to take small step independently and hold 30 seconds   Standing on One Leg Tries to lift leg/unable to hold 3 seconds but remains standing independently   Total Score 42/56 indicating continued fall risk          PT Education - 04/28/14 1505    Education provided Yes   Education Details HEP: shoulder ER stretch in doorway, feet together/ eyes closed for balance   Person(s) Educated Patient   Methods Explanation;Demonstration;Handout   Comprehension Verbalized understanding;Returned demonstration          PT Short Term Goals - 04/28/14 1311    PT SHORT TERM GOAL #1   Title The patient will be independent in HEP for L shoulder A/AROM and stretching activities.  Target date 05/14/2014   Time 4   Period Weeks   Status Achieved   PT SHORT TERM GOAL #2   Title The patient will be indep in HEP for balance and general mobility.  Target date 05/14/2014.   Time 4   Period Weeks   Status Achieved   PT SHORT TERM GOAL #3   Title The patient will improve Berg score from 31/56 up to 36/56 to demo  decreased risk for falls.  Target date 05/14/2014.   Baseline Improved from 31/56 up to 42/56.   Time 4   Period Weeks   Status Achieved   PT SHORT TERM GOAL #4   Title The patient will improve P/ROM L shoulder from 125 deg flexion/75 deg abd/22 deg ER up to 140 deg flexion/90 deg abd/30 deg ER.  Target date 05/14/2014.   Baseline See note for ROM   Time 4   Period Weeks   Status Achieved           PT Long Term Goals - 04/14/14 6712  PT LONG TERM GOAL #1   Title The patient will subjecitvely report pain < or equal to 5/10 with L shoulder movement.  Target date 05/30/2014   Time 6   Period Weeks   PT LONG TERM GOAL #2   Title The patient will improve Berg score from 31/56 up to > or equal to 40/56 to demo decreasing risk for falls.  Target date 05/30/2014.   Time 6   Period Weeks   PT LONG TERM GOAL #3   Title The patient will improve A/ROM L shoulder from 10 deg (in supine) flexion up to 120 degrees.  Target date 05/30/2014.   Time 6   Period Weeks   PT LONG TERM GOAL #4   Title The patient will improve A/ROM L shoulder abduction from 60 degrees to 130 degrees.  Target date 05/30/2014.   Time 6   Period Weeks   PT LONG TERM GOAL #5   Title The patient will be independent with post d/c wellness plan to continue progressing general safety/mobility.  Target date 05/30/2014.   Time 6   Period Weeks   Additional Long Term Goals   Additional Long Term Goals Yes   PT LONG TERM GOAL #6   Title The patient will improve gait speed from 2.23 ft/sec up to 2.6 ft/sec indicating transition to "full community ambulator" classification of gait.  Target date 05/30/2014.   Time 6   Period Weeks               Plan - 04/28/14 1506    Clinical Impression Statement The patient made significant progress with ROM.  She has currently met STGs improving significantly on balance measures and is performing HEP.  Pt on hold per report until getting approved for further [facility] visits.  Patient to  call to schedule once received.   PT Next Visit Plan Continue with shoulder strengthening and stretching; incorporate balance activities and issue balance HEP.        Problem List Patient Active Problem List   Diagnosis Date Noted  . Shoulder pain, left 02/18/2014  . Depression 02/18/2014  . HLD (hyperlipidemia) 11/19/2013  . Other malaise and fatigue 11/12/2013  . Hidradenitis suppurativa 12/18/2012  . Health care maintenance 07/08/2012  . Abnormality of gait 07/08/2012  . Diabetes mellitus type 2 in obese 07/03/2012  . Morbid obesity 07/03/2012  . Tobacco abuse 07/03/2012    WEAVER,CHRISTINA, PT 04/28/2014, 3:10 PM  [facility] [address] [place], [place], [address] Phone: [phone]   Fax:  [phone]

## 2014-04-30 ENCOUNTER — Ambulatory Visit: Payer: Self-pay | Admitting: Internal Medicine

## 2014-05-04 NOTE — Telephone Encounter (Signed)
CSW placed called to pt.  CSW left message requesting return call. CSW provided contact hours and phone number. 

## 2014-05-05 NOTE — Telephone Encounter (Signed)
Letter mailed

## 2014-05-24 ENCOUNTER — Telehealth: Payer: Self-pay | Admitting: Internal Medicine

## 2014-05-24 NOTE — Telephone Encounter (Signed)
Call to patient to confirm appointment for 05/25/14 at 3:15. lmtcb ° °

## 2014-05-25 ENCOUNTER — Encounter: Payer: Self-pay | Admitting: Internal Medicine

## 2014-05-25 ENCOUNTER — Ambulatory Visit: Payer: Self-pay

## 2014-05-26 ENCOUNTER — Ambulatory Visit: Payer: Self-pay

## 2014-05-26 ENCOUNTER — Ambulatory Visit: Payer: Self-pay | Admitting: Internal Medicine

## 2014-06-29 ENCOUNTER — Encounter: Payer: Self-pay | Admitting: Internal Medicine

## 2014-06-29 ENCOUNTER — Ambulatory Visit (INDEPENDENT_AMBULATORY_CARE_PROVIDER_SITE_OTHER): Payer: Self-pay | Admitting: Internal Medicine

## 2014-06-29 VITALS — BP 147/82 | HR 87 | Temp 98.3°F | Wt 270.7 lb

## 2014-06-29 DIAGNOSIS — E1142 Type 2 diabetes mellitus with diabetic polyneuropathy: Secondary | ICD-10-CM

## 2014-06-29 DIAGNOSIS — I10 Essential (primary) hypertension: Secondary | ICD-10-CM

## 2014-06-29 DIAGNOSIS — E1165 Type 2 diabetes mellitus with hyperglycemia: Principal | ICD-10-CM

## 2014-06-29 LAB — GLUCOSE, CAPILLARY: Glucose-Capillary: 238 mg/dL — ABNORMAL HIGH (ref 70–99)

## 2014-06-29 LAB — POCT GLYCOSYLATED HEMOGLOBIN (HGB A1C): Hemoglobin A1C: 10.5

## 2014-06-29 LAB — HM DIABETES EYE EXAM

## 2014-06-29 MED ORDER — LOVASTATIN 20 MG PO TABS: 20.0000 mg | ORAL_TABLET | Freq: Every day | ORAL | Status: DC

## 2014-06-29 MED ORDER — GLIPIZIDE 5 MG PO TABS: 5.0000 mg | ORAL_TABLET | Freq: Every day | ORAL | Status: DC

## 2014-06-29 MED ORDER — LISINOPRIL 10 MG PO TABS: 10.0000 mg | ORAL_TABLET | Freq: Every day | ORAL | Status: DC

## 2014-06-29 MED ORDER — INSULIN NPH ISOPHANE & REGULAR (70-30) 100 UNIT/ML ~~LOC~~ SUSP: SUBCUTANEOUS | Status: DC

## 2014-06-29 NOTE — Progress Notes (Signed)
Patient ID: Melanie Henry, female   DOB: [birth year], 54 y.o.   MRN: [id number]    Subjective:   Patient ID: Melanie Henry female   DOB: 06-28-1960 54 y.o.   MRN: [id number]  HPI: [name] is a 54 y.o. woman with past medical history of hypertension, insulin-dependent Type 2 DM, chronic peripheral neuropathy, hyperlipidemia, depression,  gait abnormality, and tobacco abuse who presents for follow-up of diabetes.   Her last A1c was 8.6 on 02/18/14. She reports running out of her medications for 2 months and then recently began them about 1 month ago but has since been without them again for the past 1 week. She is on Humulin 70/30 18 U in AM and 15 U in PM in addition to glipizide 2.5 mg daily. She checks her blood sugar once daily with recent values in the 200's. She forgot to bring her meter in today. She denies symptomatic hypoglycemia. She has chronic blurry vision, polydipsia, polyuria, and peripheral neuropathy but denies polyphagia or foot injury. She has chronic right foot callus and does not follow with podiatry. She has not had recent eye exam. She reports poor diet and exercise. Her weight has been stable from last visit.    She is compliant with taking lisinopril 5 mg daily that was started at last visit in November.    She is noncompliant with lovastatin and has not yet picked up the prescription.     Past Medical History  Diagnosis Date  . Obesity   . Hypertension   . Hypothyroid   . Eclampsia 1988    stroke like symtoms with pregnancy, h/o protenuria with eclampsia   . Peripheral neuropathy     As of 07/2012 patient reports she was dx'ed 25 yrs ago previously taking Neurontin and B12 shot   . Tachycardia   . Dysrhythmia   . Pneumonia ~ 1991    "once" (07/02/2012)  . Migraines     "occasionally" (07/02/2012)  . Daily headache     "for the past 2-3 wks" (07/02/2012)  . Heart murmur 1988    "I had eclampsia; they said I had a stroke" (07/02/2012)  . History of juvenile arthritis       diagnosed age 10 y.o; affects hands, knees   . Depression   . Kidney stones     "once" (07/02/2012)  . Hidradenitis suppurativa    Current Outpatient Prescriptions  Medication Sig Dispense Refill  . acetaminophen (TYLENOL) 500 MG tablet Take 500 mg by mouth every 6 (six) hours as needed for moderate pain.    . clindamycin (CLEOCIN) 300 MG capsule Take 1 capsule (300 mg total) by mouth 4 (four) times daily. (Patient not taking: Reported on 04/13/2014) 28 capsule 0  . glipiZIDE (GLUCOTROL) 5 MG tablet Take 0.5 tablets (2.5 mg total) by mouth daily before breakfast. 30 tablet 6  . insulin NPH-regular Human (HUMULIN 70/30) (70-30) 100 UNIT/ML injection Inject twice a day, 18 units in AM and 15 units in evening. 10 mL 11  . Insulin Syringe-Needle U-100 31G X 5/16" 0.3 ML MISC Use three times daily for insulin injection. diag code 250.02. Insulin dependent 100 each 0  . lisinopril (PRINIVIL,ZESTRIL) 5 MG tablet Take 1 tablet (5 mg total) by mouth daily. (Patient not taking: Reported on 04/13/2014) 30 tablet 11  . lovastatin (MEVACOR) 20 MG tablet Take 1 tablet (20 mg total) by mouth at bedtime. (Patient not taking: Reported on 04/13/2014) 30 tablet 6  . Multiple Vitamin (MULTIVITAMIN WITH MINERALS) TABS  Take 1 tablet by mouth daily.     No current facility-administered medications for this visit.   Family History  Problem Relation Age of Onset  . Diabetes Paternal Uncle    History   Social History  . Marital Status: Divorced    Spouse Name: N/A  . Number of Children: N/A  . Years of Education: N/A   Social History Main Topics  . Smoking status: Current Every Day Smoker -- 0.25 packs/day for 30 years    Types: Cigarettes  . Smokeless tobacco: Never Used     Comment: 07/02/2012 offered smoking cessation materials; pt declines  . Alcohol Use: No  . Drug Use: No  . Sexual Activity: No   Other Topics Concern  . Not on file   Social History Narrative   2 kids   Smokes 4-5 cigarettes  daily.  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  HENT: Negative for congestion.   Eyes: Negative for blurred vision.  Respiratory: Negative for cough, shortness of breath and wheezing.   Cardiovascular: Positive for leg swelling (pedal edema). Negative for chest pain.  Gastrointestinal: Positive for diarrhea. Negative for nausea, vomiting, abdominal pain, constipation and blood in stool.  Genitourinary: Negative for dysuria, urgency, frequency and hematuria.  Musculoskeletal: Positive for myalgias (occasionally ).  Skin:       Chronic right foot callus  Neurological: Positive for sensory change (chronic peripheral neuropathy ). Negative for dizziness and headaches.       Chronic gait instability     Objective:  Physical Exam: Filed Vitals:   06/29/14 1528  BP: 147/82  Pulse: 87  Temp: 98.3 F (36.8 C)  TempSrc: Oral  Weight: 270 lb 11.2 oz (122.789 kg)  SpO2: 99%    Physical Exam  Constitutional: She is oriented to person, place, and time. She appears well-developed and well-nourished. No distress.  Facial trichosis   HENT:  Head: Normocephalic and atraumatic.  Right Ear: External ear normal.  Left Ear: External ear normal.  Nose: Nose normal.  Mouth/Throat: Oropharynx is clear and moist. No oropharyngeal exudate.  Eyes: Conjunctivae and EOM are normal. Pupils are equal, round, and reactive to light. Right eye exhibits no discharge. Left eye exhibits no discharge. No scleral icterus.  Neck: Normal range of motion. Neck supple.  Cardiovascular: Normal rate, regular rhythm and normal heart sounds.   Pulmonary/Chest: Effort normal and breath sounds normal. No respiratory distress. She has no wheezes. She has no rales.  Abdominal: Soft. Bowel sounds are normal. She exhibits no distension. There is no tenderness. There is no rebound and  no guarding.  Musculoskeletal: Normal range of motion. She exhibits edema (trace b/l pedal edema). She exhibits no tenderness.  Neurological: She is alert and oriented to person, place, and time.  Skin: Skin is warm and dry. No rash noted. She is not diaphoretic. No erythema. No pallor.  Right foot callus below great toe  Psychiatric: She has a normal mood and affect. Her behavior is normal. Judgment and thought content normal.    Assessment & Plan:   Please see problem list for problem-based assessment and plan

## 2014-06-29 NOTE — Assessment & Plan Note (Signed)
Assessment: Pt with well-controlled hypertension compliant with one-class (ACEi) anti-hypertensive therapy who presents with blood pressure of 147/82.   Plan:  -BP 147/82 not at goal <140/90 -Increase lisinopril from 5 mg daily to 10 mg daily  -Last BMP on 11/12/13 was normal, repeat at next visit

## 2014-06-29 NOTE — Patient Instructions (Addendum)
-  Start taking glipizide 5 mg daily instead of 2.5 mg daily  -Increase your morning insulin to 20 U and keep taking 15 U in the afternoon, start checking your blood sugar 2-3 x daily and come back in 2-4 weeks with your meter -Will check your eyes today  -Start taking lovastatin for high cholesterol  -Increase your lisinopril from 5 mg daily to 10 mg daily -Nice meeting you and please come back in 2-4 weeks with your meter   Podiatry Clinic to be held April 7th at 4:45pm at the [facility] [address], [place] [id number]  General Instructions:   Please bring your medicines with you each time you come to clinic.  Medicines may include prescription medications, over-the-counter medications, herbal remedies, eye drops, vitamins, or other pills.   Progress Toward Treatment Goals:  Treatment Goal 02/18/2014  Hemoglobin A1C improved  Stop smoking smoking less  Prevent falls -    Self Care Goals & Plans:  Self Care Goal 03/11/2013  Manage my medications take my medicines as prescribed; bring my medications to every visit; refill my medications on time; follow the sick day instructions if I am sick  Monitor my health keep track of my blood glucose; bring my glucose meter and log to each visit; keep track of my blood pressure; check my feet daily  Eat healthy foods eat more vegetables; eat fruit for snacks and desserts; drink diet soda or water instead of juice or soda  Be physically active find an activity I enjoy  Stop smoking -    Home Blood Glucose Monitoring 02/18/2014  Check my blood sugar once a day  When to check my blood sugar before breakfast     Care Management & Community Referrals:  Referral 07/25/2012  Referrals made for care management support nutritionist; diabetes educator; social worker  Referrals made to community resources transportation; noncommercial health insurance options

## 2014-06-29 NOTE — Assessment & Plan Note (Addendum)
Assessment: Pt with last A1c of 8.6 on 02/18/14 partially compliant with insulin and oral hypoglycemic therapy with no recent symptomatic hypoglycemia who presents with CBG of 238 and worsened A1c of 10.5.  Plan:  -A1c 10.5 not at goal <7, increase Humulin 70/30 from 18 U AM/15 U PM to 20 U AM/15 U PM and increase glipizide form 2.5 mg daily to 5 mg daily. Pt to return in 2-4 weeks with glucose meter for further insulin adjustment  -BP 147/82 not at goal <140/90, increase lisinopril from 5 mg daily to 10 mg daily  -LDL 138 not at goal <100, pt instructed to start lovastatin 20 mg daily (never picked up prescription) -Obtain annual eye and foot exams today -Last annual urine microalbumin test normal on 11/12/13  -Refer to podiatry for diabetic foot care  -Consider starting gabapentin for chronic peripheral neuropathy at next visit  -BMI 46.44 not at goal <25, encourage weight loss

## 2014-06-30 ENCOUNTER — Other Ambulatory Visit: Payer: Self-pay | Admitting: Dietician

## 2014-06-30 ENCOUNTER — Encounter: Payer: Self-pay | Admitting: Dietician

## 2014-06-30 DIAGNOSIS — E1142 Type 2 diabetes mellitus with diabetic polyneuropathy: Secondary | ICD-10-CM

## 2014-06-30 DIAGNOSIS — E1165 Type 2 diabetes mellitus with hyperglycemia: Principal | ICD-10-CM

## 2014-07-01 NOTE — Progress Notes (Signed)
Internal Medicine Clinic Attending  Case discussed with Dr. Rabbani soon after the resident saw the patient.  We reviewed the resident's history and exam and pertinent patient test results.  I agree with the assessment, diagnosis, and plan of care documented in the resident's note.  

## 2014-07-02 ENCOUNTER — Encounter: Payer: Self-pay | Admitting: *Deleted

## 2014-07-08 ENCOUNTER — Ambulatory Visit: Payer: Self-pay

## 2014-08-18 ENCOUNTER — Encounter: Payer: Self-pay | Admitting: *Deleted

## 2015-02-09 ENCOUNTER — Ambulatory Visit: Payer: Self-pay

## 2015-02-22 ENCOUNTER — Encounter: Payer: Self-pay | Admitting: Student

## 2015-03-02 ENCOUNTER — Encounter: Payer: Self-pay | Admitting: Rehabilitative and Restorative Service Providers"

## 2015-03-02 NOTE — Therapy (Signed)
[facility] [address] [place], [place], [address] Phone: [phone]   Fax:  [phone]  Patient Details  Name: Melanie Henry MRN: [id number] Date of Birth: Mar 15, 1961 Referring Provider:  No ref. provider found  Encounter Date: last encounter 04/28/2014  PHYSICAL THERAPY DISCHARGE SUMMARY  Visits from Start of Care: 4  Current functional level related to goals / functional outcomes:     PT Short Term Goals - 04/28/14 1311    PT SHORT TERM GOAL #1   Title The patient will be independent in HEP for L shoulder A/AROM and stretching activities.  Target date 05/14/2014   Time 4   Period Weeks   Status Achieved   PT SHORT TERM GOAL #2   Title The patient will be indep in HEP for balance and general mobility.  Target date 05/14/2014.   Time 4   Period Weeks   Status Achieved   PT SHORT TERM GOAL #3   Title The patient will improve Berg score from 31/56 up to 36/56 to demo decreased risk for falls.  Target date 05/14/2014.   Baseline Improved from 31/56 up to 42/56.   Time 4   Period Weeks   Status Achieved   PT SHORT TERM GOAL #4   Title The patient will improve P/ROM L shoulder from 125 deg flexion/75 deg abd/22 deg ER up to 140 deg flexion/90 deg abd/30 deg ER.  Target date 05/14/2014.   Baseline See note for ROM   Time 4   Period Weeks   Status Achieved         PT Long Term Goals - 04/14/14 0919    PT LONG TERM GOAL #1   Title The patient will subjecitvely report pain < or equal to 5/10 with L shoulder movement.  Target date 05/30/2014   Time 6   Period Weeks   PT LONG TERM GOAL #2   Title The patient will improve Berg score from 31/56 up to > or equal to 40/56 to demo decreasing risk for falls.  Target date 05/30/2014.   Time 6   Period Weeks   PT LONG TERM GOAL #3   Title The patient will improve A/ROM L shoulder from 10 deg (in supine) flexion up to 120 degrees.  Target date 05/30/2014.   Time 6   Period  Weeks   PT LONG TERM GOAL #4   Title The patient will improve A/ROM L shoulder abduction from 60 degrees to 130 degrees.  Target date 05/30/2014.   Time 6   Period Weeks   PT LONG TERM GOAL #5   Title The patient will be independent with post d/c wellness plan to continue progressing general safety/mobility.  Target date 05/30/2014.   Time 6   Period Weeks   Additional Long Term Goals   Additional Long Term Goals Yes   PT LONG TERM GOAL #6   Title The patient will improve gait speed from 2.23 ft/sec up to 2.6 ft/sec indicating transition to "full community ambulator" classification of gait.  Target date 05/30/2014.   Time 6   Period Weeks     *LTGs not assessed- patient was placed on hold pending coverage from [facility] and did not return for further visits.   Remaining deficits: General mobility General deconditioning Balance Shoulder ROM Pain    Education / Equipment: HEP, general mobility and pain mgmt.  Plan: Patient agrees to discharge.  Patient goals were partially met. Patient is being discharged due to financial  reasons.  ?????       Thank you for the referral of this patient. Rudell Cobb, MPT   [place] 03/02/2015, 12:18 PM  [facility] [address] [place], [place], [address] Phone: [phone]   Fax:  [phone]

## 2015-04-20 ENCOUNTER — Ambulatory Visit: Payer: Self-pay

## 2015-05-16 ENCOUNTER — Ambulatory Visit (INDEPENDENT_AMBULATORY_CARE_PROVIDER_SITE_OTHER): Payer: Self-pay | Admitting: Internal Medicine

## 2015-05-16 ENCOUNTER — Encounter: Payer: Self-pay | Admitting: Internal Medicine

## 2015-05-16 VITALS — BP 120/71 | HR 88 | Temp 98.4°F | Wt 253.6 lb

## 2015-05-16 DIAGNOSIS — M25512 Pain in left shoulder: Secondary | ICD-10-CM

## 2015-05-16 DIAGNOSIS — M25511 Pain in right shoulder: Secondary | ICD-10-CM

## 2015-05-16 DIAGNOSIS — E1165 Type 2 diabetes mellitus with hyperglycemia: Secondary | ICD-10-CM

## 2015-05-16 DIAGNOSIS — Z596 Low income: Secondary | ICD-10-CM

## 2015-05-16 DIAGNOSIS — Z794 Long term (current) use of insulin: Secondary | ICD-10-CM

## 2015-05-16 DIAGNOSIS — R269 Unspecified abnormalities of gait and mobility: Secondary | ICD-10-CM

## 2015-05-16 DIAGNOSIS — G8929 Other chronic pain: Secondary | ICD-10-CM

## 2015-05-16 DIAGNOSIS — I10 Essential (primary) hypertension: Secondary | ICD-10-CM

## 2015-05-16 DIAGNOSIS — M25561 Pain in right knee: Secondary | ICD-10-CM

## 2015-05-16 DIAGNOSIS — E1142 Type 2 diabetes mellitus with diabetic polyneuropathy: Secondary | ICD-10-CM

## 2015-05-16 DIAGNOSIS — M25551 Pain in right hip: Secondary | ICD-10-CM

## 2015-05-16 LAB — POCT GLYCOSYLATED HEMOGLOBIN (HGB A1C): Hemoglobin A1C: 10.8

## 2015-05-16 LAB — GLUCOSE, CAPILLARY: GLUCOSE-CAPILLARY: 218 mg/dL — AB (ref 65–99)

## 2015-05-16 MED ORDER — INSULIN GLARGINE 100 UNIT/ML ~~LOC~~ SOLN: 10.0000 [IU] | Freq: Every day | SUBCUTANEOUS | Status: DC

## 2015-05-16 NOTE — Progress Notes (Signed)
Patient ID: Melanie Henry, female   DOB: 04/03/1960, 55 y.o.   MRN: [id number] Medication Samples have been provided to the patient.  Drug name: Lantus 100u/ml vial  Qty: 1  LOT: [id number]  Exp.Date: 12/2015  The patient has been instructed regarding the correct time, dose, and frequency of taking this medication, including desired effects and most common side effects. The patient has used vials and syringes for her diabetic treatment before and is comfortable with administration.   Lenward Chancellor 5:32 PM 05/16/2015

## 2015-05-16 NOTE — Patient Instructions (Signed)
Take 10 units of lantus for now. We gave you free sample and also voucher.  Go to [employer] med assist to get free medication (Lantus).  Get CT scan of head for documentation of stroke.  Follow up in 1 month for DM II

## 2015-05-16 NOTE — Progress Notes (Signed)
Subjective:    Patient ID: Melanie Henry, female    DOB: [birth year], 55 y.o.   MRN: [id number]  HPI  55 yo female with poorly controlled DM II, generalized pain, HTN,  Diabetic neuropathy, dperession here for follow up of DM II and HTN. Also discussed her chronic left sided weakness and gait abnormality.   Patient has not been taking most of her medications. She ran out of insulin about 1 week ago and ran out of glipizide about 2 months ago. Asked why she did not call us if she ran out of refill. She stated it was not that reason, it was because she could not afford her medications. She is already on the [facility] card and had been receving $6 medications, however that's also not something she can afford. She has tried to borrow money from family members but that has not been possible every month. She is not currently checking her blood sugar regularly either.  She has chronic left leg and left arm weakness. Had spastic left arm with limited ROM but she improved with PT. She could not afford PT bill so she had to stop early but continued to do some exercises at home. Asked about how she developed the weakness and whether she had a stroke. She told me "thats exactly what I think happened, I had a stroke". She had acute L weakness on March 2014, could not pick up her grand child. Did not seek any medical assistance that time. No head CT or any other brain imaging in chart.  Not taking cholesterol meds.  Has chronic R hip pain, b/l shoulder pain L>R, and R knee pain. Has left sided weakness. Has gait imbalance due to weakness, uses cane to walk around.  We discussed that she has lot of health maintenance that we need to address, however, we will focus on making sure she is able to take her diabetes medication and control that before addressing other issues later.  Review of Systems  Constitutional: Negative for fever, chills and fatigue.  HENT: Negative for congestion and sore throat.   Eyes: Negative  for photophobia and visual disturbance.  Respiratory: Negative for chest tightness, shortness of breath and wheezing.   Cardiovascular: Negative for chest pain, palpitations and leg swelling.  Gastrointestinal: Negative for abdominal pain and abdominal distention.  Endocrine: Negative.   Genitourinary: Negative for dysuria and flank pain.  Musculoskeletal: Positive for back pain, arthralgias and gait problem.  Neurological: Positive for weakness. Negative for dizziness, numbness and headaches.       Objective:   Physical Exam  Constitutional: She is oriented to person, place, and time. She appears well-developed and well-nourished.  Overweight female, flat affect.   HENT:  Head: Normocephalic and atraumatic.  Eyes: EOM are normal. Pupils are equal, round, and reactive to light. Left eye exhibits no discharge.  No facial droop.   Neck: Normal range of motion. No JVD present.  Cardiovascular: Normal rate and regular rhythm.  Exam reveals no friction rub.   No murmur heard. Pulmonary/Chest: Effort normal and breath sounds normal. No respiratory distress. She has no wheezes. She has no rales. She exhibits no tenderness.  Abdominal: Soft. Bowel sounds are normal. She exhibits no distension. There is no tenderness.  Musculoskeletal: Normal range of motion. She exhibits no edema or tenderness.  Has left sided weakness. 4/5 LUE and 4/5 LLE strength.   Neurological: She is alert and oriented to person, place, and time.    Filed Vitals:  05/16/15 1557  BP: 120/71  Pulse: 88  Temp: 98.4 F (36.9 C)        Assessment & Plan:  See problem based a&p.

## 2015-05-17 NOTE — Assessment & Plan Note (Signed)
Patient remains unable to take her insulin and glipizide regularly( missed insulin for 7+ days, glipizide for 2+months) due to inability to pay for medication despite being on the $6 list at the Health dept.   hgba1c remains uncontrolled, increased slightly from 10.5 to 10.8. Did not bring any glucometer readings as she has not been checking it.   Discussed her case with our clinical pharmacist Dr. Selena Batten to help her with free medications. We have started the process to see if she will qualify for the [facility] Med Assist for free medications.   She was on humulin 20 units AM, and 15 unis PM + glipizide  qAM.  I will switch her to Lantus 10 units daily. This is basically to simplify her regimen. Will re-assess her glycemic control in 1 month and titrate her insulin regimen further that time. I suspect she will also require meal time insulin.  Gave her 1 month free medication voucher + Lantus sample from the clinic. I hope she will be able to get free Lantus from Med Assist.

## 2015-05-17 NOTE — Assessment & Plan Note (Signed)
I suspect her abnormal gait with physical exam showing weakness on both upper and lower left ext's is likely 2/2 to a stroke which she thinks happened on March 2014. She did improve with physical therapy but could not continue seeing them due to her inability to pay for it. Using cane to walk with frequent loss of balance at home.  I will order CT head to document any previous stroke. We discussed that our goal is to prevent any future stroke regardless of what shows up on CT head. We will do this through controlling her DM II properly. If CT head shows previous CVA, will start asa 81 mg daily. She is currently not able to afford her lipid medication so we will work on that next visit.

## 2015-05-17 NOTE — Assessment & Plan Note (Signed)
Filed Vitals:   05/16/15 1557  BP: 120/71  Pulse: 88  Temp: 98.4 F (36.9 C)    States she is taking her lisinopril  daily.  Continue this.

## 2015-05-19 NOTE — Progress Notes (Signed)
Patient ID: Melanie Henry, female   DOB: 1960/10/16, 55 y.o.   MRN: [id number]  Patient was seen in clinic with Lenward Chancellor, PharmD candidate. I agree with the assessment and plan of care documented by [facility].

## 2015-05-19 NOTE — Progress Notes (Signed)
Internal Medicine Clinic Attending  Case discussed with Dr. Ahmed soon after the resident saw the patient.  We reviewed the resident's history and exam and pertinent patient test results.  I agree with the assessment, diagnosis, and plan of care documented in the resident's note. 

## 2015-05-23 ENCOUNTER — Ambulatory Visit (HOSPITAL_COMMUNITY)
Admission: RE | Admit: 2015-05-23 | Discharge: 2015-05-23 | Disposition: A | Discharge status: Home / Self Care | Payer: Self-pay | Source: Ambulatory Visit | Attending: Student in an Organized Health Care Education/Training Program | Admitting: Student in an Organized Health Care Education/Training Program

## 2015-05-23 DIAGNOSIS — R531 Weakness: Secondary | ICD-10-CM | POA: Insufficient documentation

## 2015-05-23 DIAGNOSIS — R269 Unspecified abnormalities of gait and mobility: Secondary | ICD-10-CM | POA: Insufficient documentation

## 2015-06-14 ENCOUNTER — Encounter: Payer: Self-pay | Admitting: Internal Medicine

## 2015-06-15 ENCOUNTER — Telehealth: Payer: Self-pay | Admitting: Internal Medicine

## 2015-06-15 NOTE — Telephone Encounter (Signed)
Pt has very conflicting stories, now she states she went and they had her paperwork and they were going to call her and make an appt, i informed her that Nep[facility] and teresa at [facility] stated they do not do MAP services that way and they do not have current paperwork for her, gave her the days and times, she then stated she cannot go "out there and stand around for hours and wait to be seen" informed her that she would need to do that at first because that's how the process works and there was nothing i could do, gave her the info again and wished her well

## 2015-06-15 NOTE — Telephone Encounter (Signed)
Called pt, she states she has been waiting for health dept to call her back for 2 years??? i called the health dept and they reinforced that they do not call pts and schedule appts for MAP, the pt must come to [facility] on a tues, wed, thurs with paperwork in hand- id, tax papers etc and then after that they would schedule an appt and come back to be enrolled in MAP. Will inform pt when i speak again with her She also states that she is suppose to start lantus 10u and that she read on the internet and foldout in lantus that she should be taking a larger dose of lantus than 10u, she states she should take 80% of what her humulin was, which would be 20+ u daily. i check w/ resident pharm student and he explains that since her regimen was changed completely the md did not want to go to the max dose to start with that the md would start low and adjust upward. i have tried to call pt again and had to leave a message for rtc

## 2015-06-15 NOTE — Telephone Encounter (Signed)
Called and spoke with patient about her Medical Records for DSS.  Notified the patient that her records were copied twice once on 12/07/2014 and 06/03/2015.  Explained the turn around time for DSS receiving the information and if she has any questions to make contact with them.  Both of the copies are scanned in Epic under the media tab. Pt also was asking questions about her medication and no one has contacted back about it.  Patient states she has not been able to start her meds with the Health Department.  Please advise.

## 2015-07-01 ENCOUNTER — Encounter: Payer: Self-pay | Admitting: Internal Medicine

## 2015-07-21 ENCOUNTER — Telehealth: Payer: Self-pay | Admitting: Internal Medicine

## 2015-07-21 NOTE — Telephone Encounter (Signed)
APPT. REMINDER CALL, LMTCB °

## 2015-07-22 ENCOUNTER — Encounter: Payer: Self-pay | Admitting: Internal Medicine

## 2015-07-22 ENCOUNTER — Ambulatory Visit (INDEPENDENT_AMBULATORY_CARE_PROVIDER_SITE_OTHER): Payer: Self-pay | Admitting: Internal Medicine

## 2015-07-22 VITALS — BP 129/72 | HR 102 | Temp 98.0°F | Ht 63.0 in | Wt 248.9 lb

## 2015-07-22 DIAGNOSIS — K029 Dental caries, unspecified: Secondary | ICD-10-CM

## 2015-07-22 DIAGNOSIS — R269 Unspecified abnormalities of gait and mobility: Secondary | ICD-10-CM

## 2015-07-22 DIAGNOSIS — E1165 Type 2 diabetes mellitus with hyperglycemia: Secondary | ICD-10-CM

## 2015-07-22 DIAGNOSIS — E1142 Type 2 diabetes mellitus with diabetic polyneuropathy: Secondary | ICD-10-CM

## 2015-07-22 DIAGNOSIS — Z794 Long term (current) use of insulin: Secondary | ICD-10-CM

## 2015-07-22 DIAGNOSIS — I10 Essential (primary) hypertension: Secondary | ICD-10-CM

## 2015-07-22 DIAGNOSIS — K0889 Other specified disorders of teeth and supporting structures: Secondary | ICD-10-CM

## 2015-07-22 DIAGNOSIS — F17210 Nicotine dependence, cigarettes, uncomplicated: Secondary | ICD-10-CM

## 2015-07-22 MED ORDER — INSULIN NPH ISOPHANE & REGULAR (70-30) 100 UNIT/ML ~~LOC~~ SUSP: SUBCUTANEOUS | Status: DC

## 2015-07-22 NOTE — Assessment & Plan Note (Addendum)
Pt reports fasting low blood sugar was down to 64 last week.  She has not yet started lantus and would like a refill of the 70/30.  In order to obtain the lantus through MAP, she has to get her paperwork together but has been too tired and lack of transportation to the county pharmacy to get this done.   -refill 70/30 17 units in AM, 15 units in PM -will hold off on lantus for now  -[facility]

## 2015-07-22 NOTE — Progress Notes (Signed)
Patient ID: Melanie BenchZoe Gaona, female   DOB: 11/11/1960, 55 y.o.   MRN: [id number]     Subjective:   Patient ID: Melanie Henry female    DOB: 09/12/1960 55 y.o.    MRN: [id number] Health Maintenance Due: Health Maintenance Due  Topic Date Due  . Hepatitis C Screening  10-25-1960  . PNEUMOCOCCAL POLYSACCHARIDE VACCINE (1) 11/03/1964  . HIV Screening  11/03/1977  . TETANUS/TDAP  11/03/1981  . MAMMOGRAM  11/03/2012  . COLONOSCOPY  11/03/2012  . LIPID PANEL  11/13/2014  . OPHTHALMOLOGY EXAM  06/29/2015  . FOOT EXAM  07/08/2015    _________________________________________________  HPI: [name] is a 55 y.o. female here for dental pain and referral.  Pt has a PMH outlined below.  Please see problem-based charting assessment and plan for further status of patient's chronic medical problems addressed at today's visit.  PMH: Past Medical History  Diagnosis Date  . Obesity   . Hypertension   . Hypothyroid   . Eclampsia 1988    stroke like symtoms with pregnancy, h/o protenuria with eclampsia   . Peripheral neuropathy (HCC)     As of 07/2012 patient reports she was dx'ed 25 yrs ago previously taking Neurontin and B12 shot   . Tachycardia   . Dysrhythmia   . Pneumonia ~ 1991    "once" (07/02/2012)  . Migraines     "occasionally" (07/02/2012)  . Daily headache     "for the past 2-3 wks" (07/02/2012)  . Heart murmur 1988    "I had eclampsia; they said I had a stroke" (07/02/2012)  . History of juvenile arthritis     diagnosed age 55 y.o; affects hands, knees   . Depression   . Kidney stones     "once" (07/02/2012)  . Hidradenitis suppurativa     Medications: Current Outpatient Prescriptions on File Prior to Visit  Medication Sig Dispense Refill  . acetaminophen (TYLENOL) 500 MG tablet Take 500 mg by mouth every 6 (six) hours as needed for moderate pain.    . Insulin Syringe-Needle U-100 31G X 5/16" 0.3 ML MISC Use three times daily for insulin injection. diag code 250.02. Insulin  dependent 100 each 0  . lisinopril (PRINIVIL,ZESTRIL) 10 MG tablet Take 1 tablet (10 mg total) by mouth daily. 30 tablet 5  . Multiple Vitamin (MULTIVITAMIN WITH MINERALS) TABS Take 1 tablet by mouth daily.     No current facility-administered medications on file prior to visit.    Allergies: Allergies  Allergen Reactions  . Tetracyclines & Related Anaphylaxis, Hives and Nausea And Vomiting  . Aspirin     Hallucinations   . Metformin And Related     Diarrhea, nausea and vomiting.    FH: Family History  Problem Relation Age of Onset  . Diabetes Paternal Uncle   . Glaucoma Maternal Grandmother     SH: Social History   Social History  . Marital Status: Divorced    Spouse Name: N/A  . Number of Children: N/A  . Years of Education: 14   Occupational History  .  Unemployed   Social History Main Topics  . Smoking status: Current Every Day Smoker -- 0.25 packs/day for 30 years    Types: Cigarettes  . Smokeless tobacco: Never Used  . Alcohol Use: 0.0 oz/week    0 Standard drinks or equivalent per week     Comment: Rarely.  . Drug Use: No  . Sexual Activity: Not on file   Other Topics Concern  . Not  on file   Social History Narrative   2 kids   Smokes 4-5 cigarettes daily.  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  Eyes: Negative for blurred vision.  Respiratory: Negative for cough and shortness of breath.  Cardiovascular: Negative for chest pain.  Gastrointestinal: Negative for nausea, vomiting. Neurological: Negative for dizziness.   Objective:   Vital Signs: Filed Vitals:   07/22/15 1531 07/22/15 1532  BP:  129/72  Pulse:  102  Temp: 98 F (36.7 C)   TempSrc: Oral   Height:  (1.6 m)   Weight: 248 lb 14.4 oz (112.9 kg)   SpO2:  96%      BP Readings from Last 3 Encounters:  07/22/15 129/72  05/16/15 120/71  06/29/14 147/82     Physical Exam: Constitutional: Vital signs reviewed.  Patient is in NAD and cooperative with exam.  Head: Normocephalic and atraumatic. Eyes: EOMI, conjunctivae nl, no scleral icterus.  Neck: Supple. Cardiovascular: RRR, no MRG. Pulmonary/Chest: Normal effort, CTAB, no wheezes, rales, or rhonchi. Abdominal: Soft. NT/ND +BS. Neurological: A&O x3, cranial nerves II-XII are grossly intact, moving all extremities. Extremities: No LE edema. Skin: Warm, dry and intact. No rash.   Assessment & Plan:   Assessment and plan was discussed and formulated with my attending.

## 2015-07-22 NOTE — Patient Instructions (Signed)
Thank you for your visit today.   Please return to the internal medicine clinic in about 1 month or sooner if needed.     I have made the following additions/changes to your medications:  Please continue to use the 70/30 insulin 17 units in the AM and 15 units in the PM.  Please do not take lantus at the same time but try to get the paperwork in for the lantus. I will refer you to dentistry for your teeth.  Please be sure to bring all of your medications with you to every visit; this includes herbal supplements, vitamins, eye drops, and any over-the-counter medications.   Should you have any questions regarding your medications and/or any new or worsening symptoms, please be sure to call the clinic at [phone].   If you believe that you are suffering from a life threatening condition or one that may result in the loss of limb or function, then you should call 911 and proceed to the nearest Emergency Department.   A healthy lifestyle and preventative care can promote health and wellness.   Maintain regular health, dental, and eye exams.  Eat a healthy diet. Foods like vegetables, fruits, whole grains, low-fat dairy products, and lean protein foods contain the nutrients you need without too many calories. Decrease your intake of foods high in solid fats, added sugars, and salt. Get information about a proper diet from your caregiver, if necessary.  Regular physical exercise is one of the most important things you can do for your health. Most adults should get at least 150 minutes of moderate-intensity exercise (any activity that increases your heart rate and causes you to sweat) each week. In addition, most adults need muscle-strengthening exercises on 2 or more days a week.   Maintain a healthy weight. The body mass index (BMI) is a screening tool to identify possible weight problems. It provides an estimate of body fat based on height and weight. Your caregiver can help determine your  BMI, and can help you achieve or maintain a healthy weight. For adults 20 years and older:  A BMI below 18.5 is considered underweight.  A BMI of 18.5 to 24.9 is normal.  A BMI of 25 to 29.9 is considered overweight.  A BMI of 30 and above is considered obese.

## 2015-07-22 NOTE — Assessment & Plan Note (Signed)
BP well controlled, not on any meds. -cont to monitor

## 2015-07-22 NOTE — Assessment & Plan Note (Addendum)
Pt has experienced multiple falls.  Likely due to neuropathy with history of smoking and uncontrolled DM, will need to obtain screening ABI.   -ABIs -declines PT due to cost, but will inquire about if this is paid for by [employer] card

## 2015-07-22 NOTE — Assessment & Plan Note (Signed)
Pt reports dental pain and caries that is causing her pain.  No fever/chills. -referral to dentistry

## 2015-07-24 NOTE — Progress Notes (Signed)
Case discussed with Dr. Gill soon after the resident saw the patient.  We reviewed the resident's history and exam and pertinent patient test results.  I agree with the assessment, diagnosis and plan of care documented in the resident's note. 

## 2015-08-11 ENCOUNTER — Ambulatory Visit (HOSPITAL_COMMUNITY): Admission: RE | Admit: 2015-08-11 | Payer: Self-pay | Source: Ambulatory Visit

## 2015-11-02 ENCOUNTER — Ambulatory Visit: Payer: Self-pay

## 2016-03-29 ENCOUNTER — Telehealth: Payer: Self-pay | Admitting: Internal Medicine

## 2016-03-29 ENCOUNTER — Encounter: Payer: Self-pay | Admitting: Internal Medicine

## 2016-03-29 NOTE — Telephone Encounter (Signed)
Called pt and planned appointment. Ask her if she was having thoughts of harming self or others and she states yes, they come and go, ask if she has a plan and she states no. Ask her to go to [facility] by calling 911, she refuses at this time but states if her thoughts get worse she will call 911 and go. She is also given info for [facility]

## 2016-03-29 NOTE — Telephone Encounter (Signed)
APT. REMINDER CALL, LMTCB °

## 2016-04-03 ENCOUNTER — Ambulatory Visit (INDEPENDENT_AMBULATORY_CARE_PROVIDER_SITE_OTHER): Payer: Self-pay | Admitting: Internal Medicine

## 2016-04-03 VITALS — BP 137/79 | HR 105 | Temp 98.0°F | Ht 63.0 in | Wt 235.7 lb

## 2016-04-03 DIAGNOSIS — G4700 Insomnia, unspecified: Secondary | ICD-10-CM

## 2016-04-03 DIAGNOSIS — F32A Depression, unspecified: Secondary | ICD-10-CM

## 2016-04-03 DIAGNOSIS — F329 Major depressive disorder, single episode, unspecified: Secondary | ICD-10-CM

## 2016-04-03 DIAGNOSIS — I10 Essential (primary) hypertension: Secondary | ICD-10-CM

## 2016-04-03 DIAGNOSIS — R45851 Suicidal ideations: Secondary | ICD-10-CM

## 2016-04-03 DIAGNOSIS — F17210 Nicotine dependence, cigarettes, uncomplicated: Secondary | ICD-10-CM

## 2016-04-03 DIAGNOSIS — Z915 Personal history of self-harm: Secondary | ICD-10-CM

## 2016-04-03 MED ORDER — ZOLPIDEM TARTRATE 5 MG PO TABS: 5.0000 mg | ORAL_TABLET | Freq: Every evening | ORAL | 0 refills | Status: DC | PRN

## 2016-04-03 NOTE — Progress Notes (Signed)
CC: Depression  HPI:  [name] is a 56 y.o. female with a past medical history listed below here today for follow up of her depression.  HTN  BP was 161/75 on initial presentation. Previously on Lisinopril but not currently on any medications. Improved to 137/79 on recheck. Tachycardic to 116 with minimal improvement to 105 on recheck. Sinus tachycardia on exam. Denies any headaches, chest pain, palpitations, nausea/vomiting, vision changes.   Depression  Requesting to see a counselor or therapist. Has has depression off and on she she was 15. Has been on medications in the past. Paxil, Zoloft, Lithium and Seroquel. Reports not saying on any longer than 6 months due to side effects and not being effective for her. Cannot recall any specific side effects today. Has been off any medications for depression for >20 years. Reports not being interested in trying any further medications for her symptoms. She reports being in therapy 15 years ago but has not been to anyone since moving to [place]. Says her depression has come back over the past 6-8 months and has gotten significantly worse in the past month. She reports that her fiance (toghther for 7 years) died a little over a month ago and that is when things got worse. Nothing specific happened 6-8 months ago. She says that her depression comes back every 6-7 years and she needs to go back for counseling.   PHQ 9 today was 27. Does report thoughts of killing herself that are almost constant. Says she would never act on these thoughts any more. Reports it is a spiritual thing and 'its not hers to take.' No specific plans. Reports more does not care if she lives or dies feeling that active suicidal thoughts. No homicidal ideations.   Reports insomnia. Only sleeping for 2-3 hours every other day for the past month. Difficulties both falling asleep and staying asleep.   History of suicide attempts; once when she was 15, 3 times when she was 19  and once when she was 25. Has tried cutting, drowning and pill overdose.   For further details of today's visit and the status of her chronic medical issues please refer to the assessment and plan.  Past Medical History:  Diagnosis Date  . Daily headache    "for the past 2-3 wks" (07/02/2012)  . Depression   . Dysrhythmia   . Eclampsia 1988   stroke like symtoms with pregnancy, h/o protenuria with eclampsia   . Heart murmur 1988   "I had eclampsia; they said I had a stroke" (07/02/2012)  . Hidradenitis suppurativa   . History of juvenile arthritis    diagnosed age 56 y.o; affects hands, knees   . Hypertension   . Hypothyroid   . Kidney stones    "once" (07/02/2012)  . Migraines    "occasionally" (07/02/2012)  . Obesity   . Peripheral neuropathy (HCC)    As of 07/2012 patient reports she was dx'ed 25 yrs ago previously taking Neurontin and B12 shot   . Pneumonia ~ 1991   "once" (07/02/2012)  . Tachycardia     Review of Systems:   Review of Systems  Constitutional: Negative for weight loss.  Eyes: Negative for blurred vision and double vision.  Cardiovascular: Negative for chest pain and palpitations.  Gastrointestinal: Negative for nausea and vomiting.  Psychiatric/Behavioral: Positive for depression and suicidal ideas. The patient has insomnia.    Physical Exam:  Vitals:   04/03/16 0853 04/03/16 0935  BP: (!) 161/75 137/79  Pulse: (!) 116 (!) 105  Temp: 98 F (36.7 C)   TempSrc: Oral   SpO2: 98%   Weight: 235 lb 11.2 oz (106.9 kg)   Height: 5\' 3"  (1.6 m)    Physical Exam  Constitutional: She is well-developed, well-nourished, and in no distress.  Cardiovascular: Regular rhythm, S1 normal and S2 normal.  Tachycardia present.  Exam reveals no gallop and no friction rub.   No murmur heard. Pulmonary/Chest: Effort normal and breath sounds normal.  Psychiatric: She exhibits a depressed mood. She expresses no homicidal ideation. She expresses no suicidal plans and no homicidal  plans. She is apathetic.  Suicidal ideations present   Assessment & Plan:   See Encounters Tab for problem based charting.  Patient discussed with Dr. Rogelia Boga

## 2016-04-03 NOTE — Patient Instructions (Addendum)
Ms. Renae GlossShelton,  I am sorry to hear about your loss. I am glad you reached out to the clinic and let us know you are having some difficulties and would like to see a counselor.  I am going to refer you to [facility] who can help get you set up to see a counselor. Their address is [address], [place], [place] [address]. Their phone number is [phone].   I am also going to start you on a new medication to help you sleep. Please take on pill of Ambien as needed to help you get to sleep.   If you have any thoughts of acting on your suicidal thoughts, please call 911 immediately or go to [facility].   Please follow up with us Monday or Tuesday of next week.

## 2016-04-03 NOTE — Assessment & Plan Note (Signed)
BP was 161/75 on initial presentation. Previously on Lisinopril but not currently on any medications. Improved to 137/79 on recheck. Tachycardic to 116 with minimal improvement to 105 on recheck. Sinus tachycardia on exam. Denies any headaches, chest pain, palpitations, nausea/vomiting, vision changes.   Plan Continue to monitor

## 2016-04-03 NOTE — Assessment & Plan Note (Addendum)
Requesting to see a counselor or therapist. Has has depression off and on she she was 15. Has been on medications in the past. Paxil, Zoloft, Lithium and Seroquel. Reports not saying on any longer than 6 months due to side effects and not being effective for her. Cannot recall any specific side effects today. Has been off any medications for depression for >20 years. Reports not being interested in trying any further medications for her symptoms. She reports being in therapy 15 years ago but has not been to anyone since moving to [place]. Says her depression has come back over the past 6-8 months and has gotten significantly worse in the past month. She reports that her fiance (toghther for 7 years) died a little over a month ago and that is when things got worse. Nothing specific happened 6-8 months ago. She says that her depression comes back every 6-7 years and she needs to go back for counseling.   PHQ 9 today was 27. Does report thoughts of killing herself that are almost constant. Says she would never act on these thoughts any more. Reports it is a spiritual thing and 'its not hers to take.' No specific plans. Reports more does not care if she lives or dies feeling that active suicidal thoughts. No homicidal ideations.   Reports insomnia. Only sleeping for 2-3 hours every other day for the past month. Difficulties both falling asleep and staying asleep.   History of suicide attempts; once when she was 15, 3 times when she was 19 and once when she was 25. Has tried cutting, drowning and pill overdose.    Plan Suspect there is a component of acute grief reaction exacerbating her chronic depression symptoms given the sudden and unexpected loss of her fiance.   Provided information for [facility] to get established with a counselor. She reports that she is able to go and see them today and that her father can drive her there after our appointment this morning. She does not have active intentions of  harming herself despite her suicidal ideations and is not an imminent threat to herself or others at this time.   Will provide Rx for Ambien for her insomnia. Given her suicidal thoughts and remote history of intentional overdose, will only provide 5 pills today.  Follow up in 1 week.

## 2016-04-06 NOTE — Progress Notes (Signed)
Internal Medicine Clinic Attending  Case discussed with Dr. Boswell soon after the resident saw the patient.  We reviewed the resident's history and exam and pertinent patient test results.  I agree with the assessment, diagnosis, and plan of care documented in the resident's note. 

## 2016-04-25 IMAGING — CT CT HEAD W/O CM
2 series · 16 of 30 positions shown, 20 images · non-contrast
Comparison: None.

CLINICAL DATA: LEFT-sided weakness for the last 3 years. Initial
investigation for remote cerebral infarction.

EXAM:
CT HEAD WITHOUT CONTRAST
TECHNIQUE: Contiguous axial images were obtained from the base of the skull
through the vertex without intravenous contrast.

[Series 201: head w/o, idose (1) · axial · non-contrast · 0.44mm/px · z∈[+70,+190]mm · 13 of 30 slices shown, 17 images]
[im 3/30  brain]
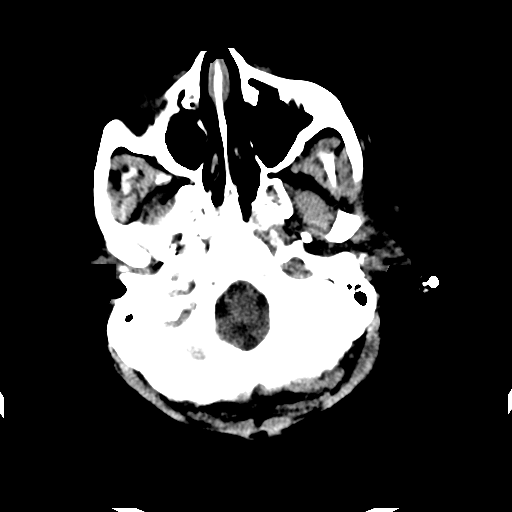
[im 3/30  bone]
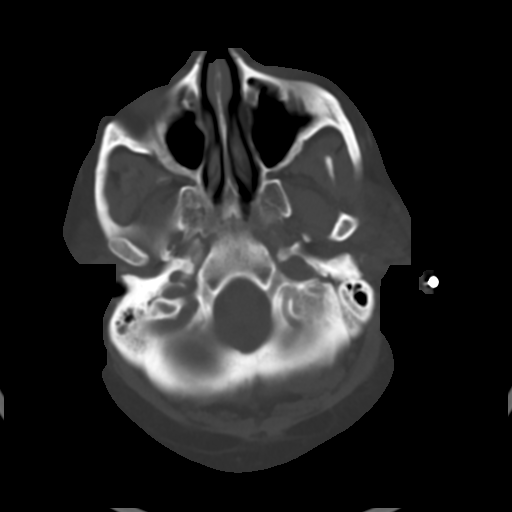
[im 5/30  brain]
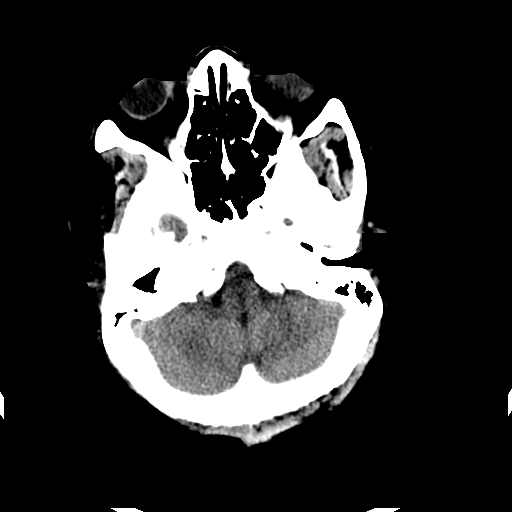
[im 7/30  brain]
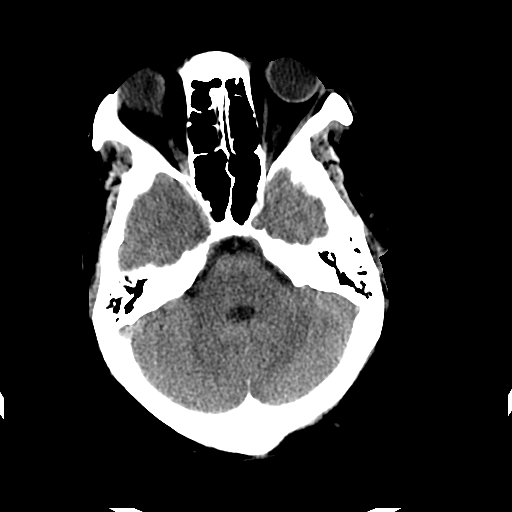
[im 9/30  brain]
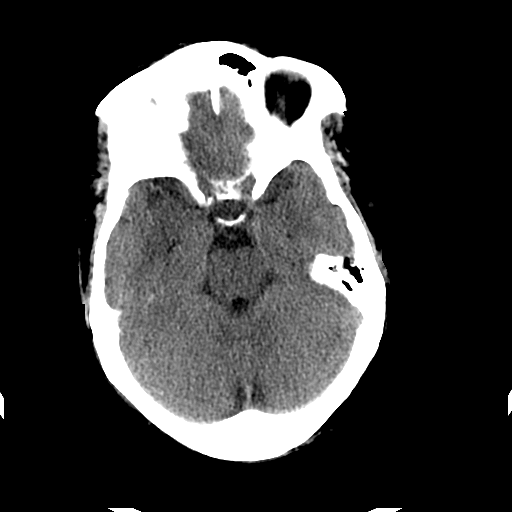
[im 11/30  brain]
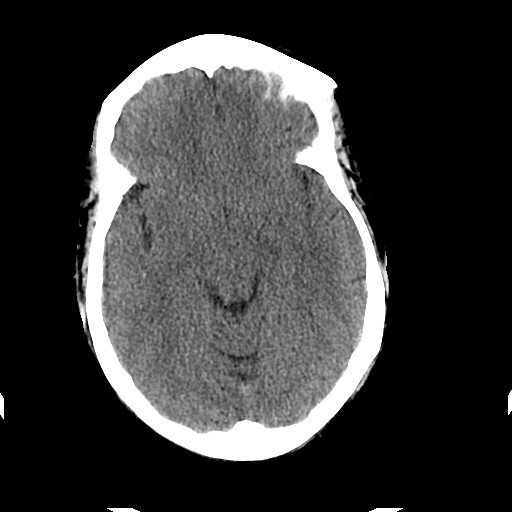
[im 11/30  bone]
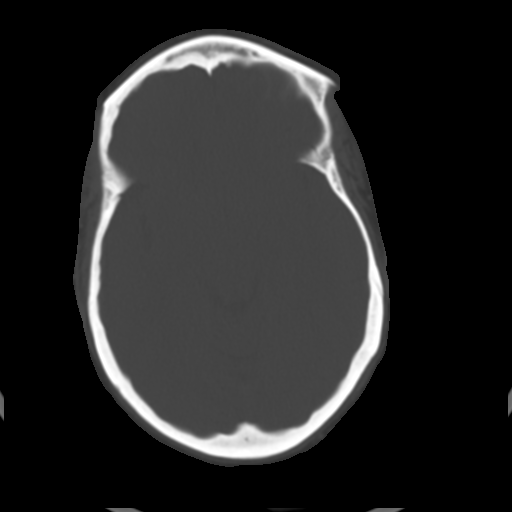
[im 13/30  brain]
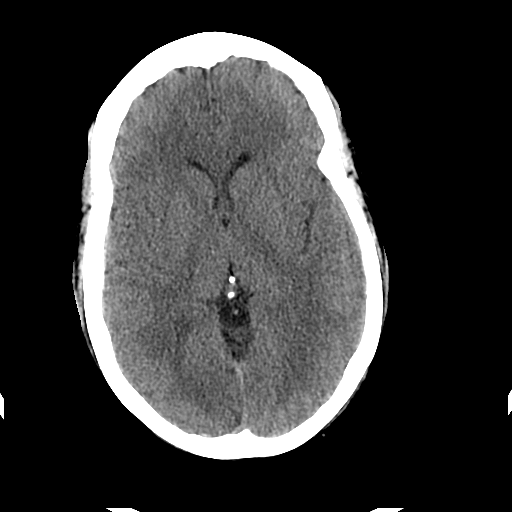
[im 15/30  brain]
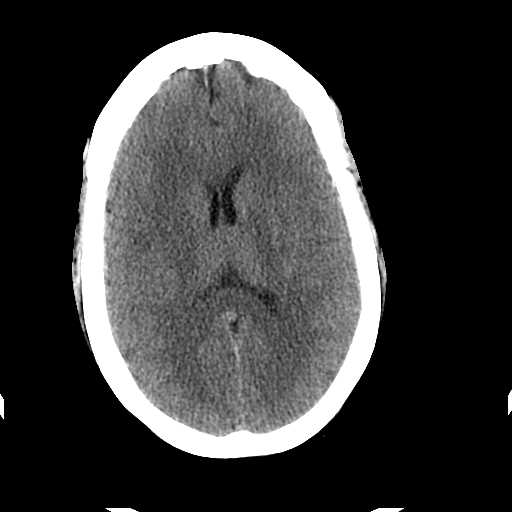
[im 17/30  brain]
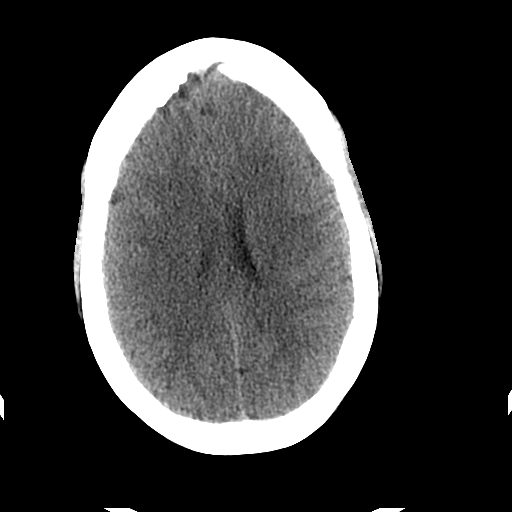
[im 19/30  brain]
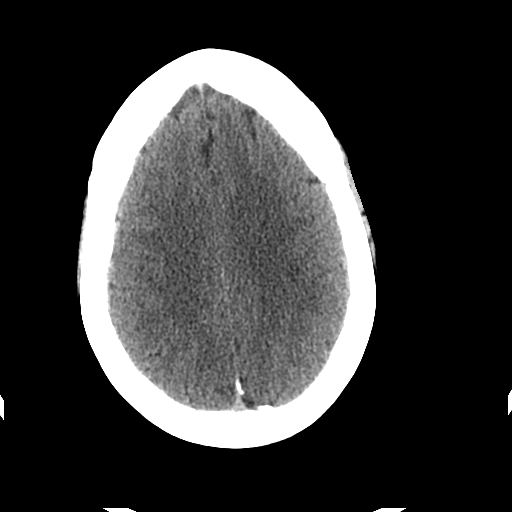
[im 19/30  bone]
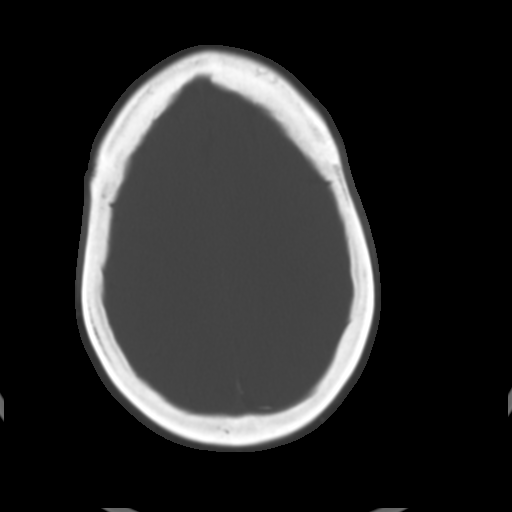
[im 21/30  brain]
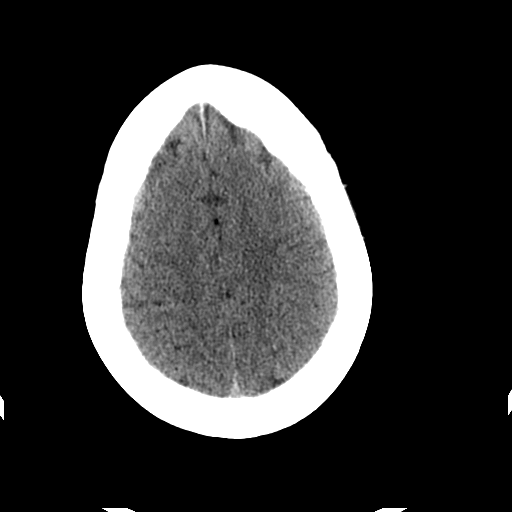
[im 23/30  brain]
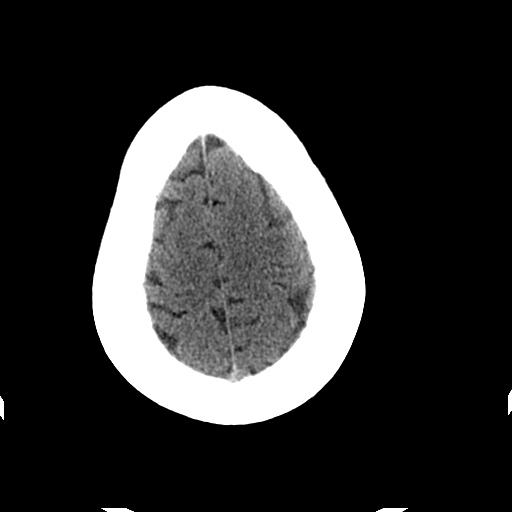
[im 25/30  brain]
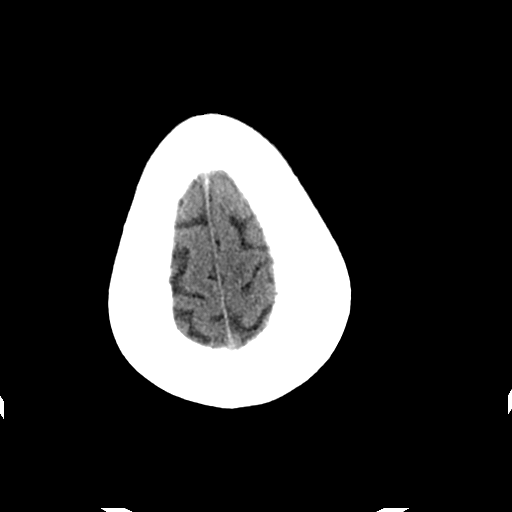
[im 27/30  brain]
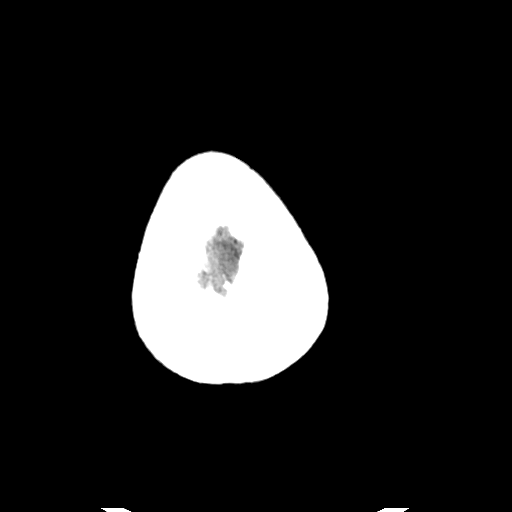
[im 27/30  bone]
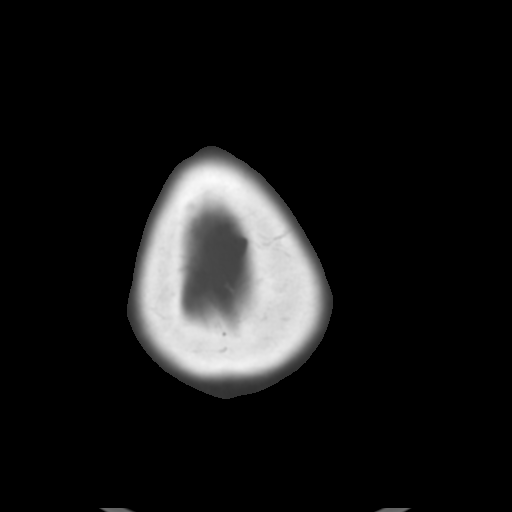

[Series 202: head w/o bone, idose (1) · axial · non-contrast · 0.44mm/px · z∈[+70,+110]mm · 3 of 30 slices shown]
[im 3/30  bone]
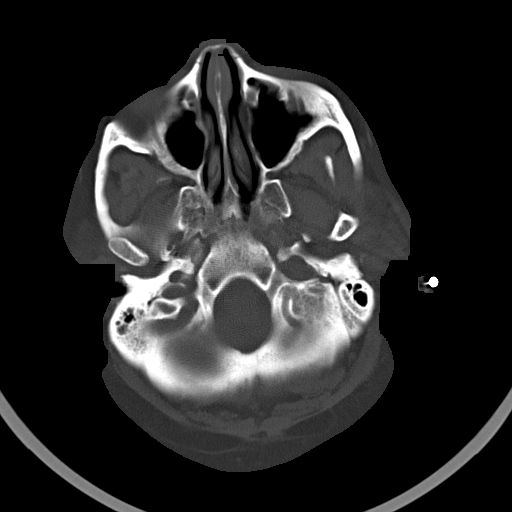
[im 7/30  bone]
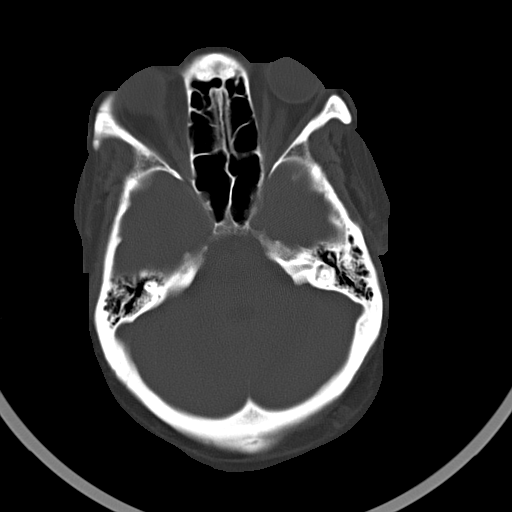
[im 11/30  bone]
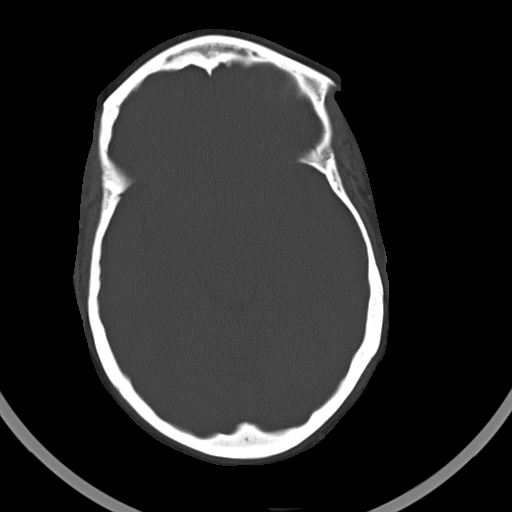

[16 of 30 positions shown; findings below may reference images not displayed]

FINDINGS: No evidence for acute infarction, hemorrhage, mass lesion,
hydrocephalus, or extra-axial fluid. No atrophy or white matter
disease. Intact calvarium. No acute sinus or mastoid disease.
IMPRESSION: Negative exam.

## 2016-06-01 ENCOUNTER — Telehealth: Payer: Self-pay | Admitting: General Practice

## 2016-06-01 NOTE — Telephone Encounter (Signed)
CALLED PATIENT, LMTCB, IT IS TIME TO RENEW [facility] CARD °

## 2016-09-07 ENCOUNTER — Encounter: Payer: Self-pay | Admitting: *Deleted

## 2017-09-30 ENCOUNTER — Other Ambulatory Visit: Payer: Self-pay

## 2017-09-30 ENCOUNTER — Ambulatory Visit (INDEPENDENT_AMBULATORY_CARE_PROVIDER_SITE_OTHER): Payer: Self-pay | Admitting: Internal Medicine

## 2017-09-30 VITALS — BP 150/54 | HR 111 | Wt 260.3 lb

## 2017-09-30 DIAGNOSIS — I10 Essential (primary) hypertension: Secondary | ICD-10-CM

## 2017-09-30 DIAGNOSIS — F329 Major depressive disorder, single episode, unspecified: Secondary | ICD-10-CM

## 2017-09-30 DIAGNOSIS — L732 Hidradenitis suppurativa: Secondary | ICD-10-CM

## 2017-09-30 DIAGNOSIS — Z794 Long term (current) use of insulin: Secondary | ICD-10-CM

## 2017-09-30 DIAGNOSIS — F339 Major depressive disorder, recurrent, unspecified: Secondary | ICD-10-CM

## 2017-09-30 DIAGNOSIS — F32A Depression, unspecified: Secondary | ICD-10-CM

## 2017-09-30 DIAGNOSIS — E1165 Type 2 diabetes mellitus with hyperglycemia: Secondary | ICD-10-CM

## 2017-09-30 DIAGNOSIS — E1142 Type 2 diabetes mellitus with diabetic polyneuropathy: Secondary | ICD-10-CM

## 2017-09-30 LAB — POCT GLYCOSYLATED HEMOGLOBIN (HGB A1C): HEMOGLOBIN A1C: 8.1 % — AB (ref 4.0–5.6)

## 2017-09-30 LAB — GLUCOSE, CAPILLARY: Glucose-Capillary: 367 mg/dL — ABNORMAL HIGH (ref 70–99)

## 2017-09-30 MED ORDER — GLIPIZIDE ER 5 MG PO TB24: 5.0000 mg | ORAL_TABLET | Freq: Every day | ORAL | 0 refills | Status: DC

## 2017-09-30 NOTE — Assessment & Plan Note (Addendum)
Assessment: Uncontrolled in the past. Currently taking 70/30 insulin BID. Hbg a1c 8.1 today (down from 10 three years ago).C/b peripheral neuropathy, declines gabapentin at this time. One episode of symptomatic hypoglycemia in the past month.    - Continue 70/30 insulin BID - Restart glipizide 5mg  daily - Overdue for retinal eye exam.  - Encouraged low-carb diet - Schedule appt with PCP to reestablish care

## 2017-09-30 NOTE — Assessment & Plan Note (Signed)
Assessment:  PHQ-9 score of 27. Patient is passively suicidal. Not ready to start anti-depressant medication (concerned about side effects) but would like therapy. In the past, anti-depressants have been helpful.   Plan: - Referral placed to [facility]orial therapy. Patient instructed to call them and schedule appt - Revisit use of anti-depressant medicine at next appt with PCP

## 2017-09-30 NOTE — Assessment & Plan Note (Signed)
Not extremely elevated today. Has been on lisinopril in the past. Will defer management to PCP visit in 3 weeks

## 2017-09-30 NOTE — Progress Notes (Signed)
   CC: Diabetes management   HPI:  [name] is a 57 y.o. with h/o uncontrolled type 2 DM, peripheral neuropathy, Hidradenitis suppurativa, hypertension, and depression who presents for refill of glipizide and questions about disability. Patient was last seen in clinic 3 years ago.   Please see problem based charting for full details on HPI.   Past Medical History:  Diagnosis Date  . Daily headache    "for the past 2-3 wks" (07/02/2012)  . Depression   . Dysrhythmia   . Eclampsia 1988   stroke like symtoms with pregnancy, h/o protenuria with eclampsia   . Heart murmur 1988   "I had eclampsia; they said I had a stroke" (07/02/2012)  . Hidradenitis suppurativa   . History of juvenile arthritis    diagnosed age 57 y.o; affects hands, knees   . Hypertension   . Hypothyroid   . Kidney stones    "once" (07/02/2012)  . Migraines    "occasionally" (07/02/2012)  . Obesity   . Peripheral neuropathy (HCC)    As of 07/2012 patient reports she was dx'ed 25 yrs ago previously taking Neurontin and B12 shot   . Pneumonia ~ 1991   "once" (07/02/2012)  . Tachycardia    Review of Systems:    General: Denies fever, chills, unexpected weight loss,  Gastrointestinal: Denies nausea, vomiting, abdominal pain Skin: Wounds and scar tissue in bilateral armpits, breasts, groin Neurological: Denies dizziness, headaches, +numbness in both feet and hands Psychiatric/Behavioral: +Depressed mood and intermittent sleep disturbances   Physical Exam:  Vitals:   09/30/17 1327  BP: (!) 150/54  Pulse: (!) 111  SpO2: 97%  Weight: 260 lb 4.8 oz (118.1 kg)   General: Vital signs reviewed.  Patient is well-developed and well-nourished, in no acute distress and cooperative with exam.  Head: Normocephalic and atraumatic. Eyes: EOMI, conjunctivae normal, no scleral icterus.  Cardiovascular: sinus tachycardia, S1 normal, S2 normal, no murmurs, gallops, or rubs. Pulmonary/Chest: Clear to auscultation bilaterally,  no wheezes, rales, or rhonchi. Abdominal: Soft, non-tender, non-distended,  Extremities: No lower extremity edema bilaterally,  pulses symmetric and intact bilaterally. No cyanosis or clubbing. Skin: Diffuse nodular wounds in bilateral armpits, underneath bilateral breasts, bilateral groin, and suprapubic region. Malodor present on left armpit and left breast. Purulent drainage present under left breast. Pink, tender, nondraining nodules present in right armpit and right groin.  Psychiatric: Depressed mood and affect. speech and behavior is normal. Cognition and memory are normal.    Assessment & Plan:   See Encounters Tab for problem based charting.  Patient seen with Dr. Criselda PeachesMullen

## 2017-09-30 NOTE — Patient Instructions (Addendum)
It was nice seeing you today. Thank you for choosing [facility] for your Primary Care.   Today we talked about:  1) Diabetes: Good job on your a1c! Continue taking insulin and checking blood sugars. Start glipizide 5mg  daily with breakfast. 2) Hidradenitis suppurativa: I will document this more clearly in your chart 3) Depression: A referral has been placed to [facility]. Please call them and please continue to think about starting an anti-depressant medication   FOLLOW-UP INSTRUCTIONS When: 7/22 at 1:30pm with your PCP Dr. Chesley MiresBloomfield For: Establish care What to bring: all medications and glucose monitor  Please contact the clinic if you have any problems, or need to be seen sooner.

## 2017-09-30 NOTE — Assessment & Plan Note (Signed)
Assessment: Diagnosed at age 57. Diffuse nodular lesions in bilateral armpits, breasts, groin, and suprapubic region. Reports difficulty walking and picking up things due to pain. Currently, draining lesions are present under left breast and right armpit and very tender to palpation. Has taken antibiotics multiple times in the past.  Plan: - Continue supportive treatment (warm towels, vick's vapor rub) - Apply for disability

## 2017-10-02 ENCOUNTER — Ambulatory Visit: Payer: Self-pay

## 2017-10-02 NOTE — Progress Notes (Signed)
Internal Medicine Clinic Attending  I saw and evaluated the patient.  I personally confirmed the key portions of the history and exam documented by Dr. Vogel and I reviewed pertinent patient test results.  The assessment, diagnosis, and plan were formulated together and I agree with the documentation in the resident's note.  

## 2017-10-21 ENCOUNTER — Encounter: Payer: Self-pay | Admitting: Internal Medicine

## 2017-11-19 ENCOUNTER — Encounter: Payer: Self-pay | Admitting: Internal Medicine

## 2017-11-25 ENCOUNTER — Encounter: Payer: Self-pay | Admitting: Internal Medicine

## 2017-12-01 ENCOUNTER — Encounter: Payer: Self-pay | Admitting: Internal Medicine

## 2017-12-25 NOTE — Addendum Note (Signed)
Addended by: Neomia Dear on: 12/25/2017 06:26 PM   Modules accepted: Orders

## 2018-01-09 ENCOUNTER — Ambulatory Visit (INDEPENDENT_AMBULATORY_CARE_PROVIDER_SITE_OTHER): Payer: Self-pay | Admitting: Internal Medicine

## 2018-01-09 ENCOUNTER — Other Ambulatory Visit: Payer: Self-pay

## 2018-01-09 VITALS — BP 151/75 | HR 86 | Temp 98.7°F | Ht 63.0 in | Wt 259.9 lb

## 2018-01-09 DIAGNOSIS — R5383 Other fatigue: Secondary | ICD-10-CM

## 2018-01-09 DIAGNOSIS — Z79899 Other long term (current) drug therapy: Secondary | ICD-10-CM

## 2018-01-09 DIAGNOSIS — E1142 Type 2 diabetes mellitus with diabetic polyneuropathy: Secondary | ICD-10-CM

## 2018-01-09 DIAGNOSIS — Z1159 Encounter for screening for other viral diseases: Secondary | ICD-10-CM

## 2018-01-09 DIAGNOSIS — E1165 Type 2 diabetes mellitus with hyperglycemia: Secondary | ICD-10-CM

## 2018-01-09 DIAGNOSIS — Z794 Long term (current) use of insulin: Secondary | ICD-10-CM

## 2018-01-09 DIAGNOSIS — M25511 Pain in right shoulder: Secondary | ICD-10-CM

## 2018-01-09 DIAGNOSIS — Z6841 Body Mass Index (BMI) 40.0 and over, adult: Secondary | ICD-10-CM

## 2018-01-09 DIAGNOSIS — I10 Essential (primary) hypertension: Secondary | ICD-10-CM

## 2018-01-09 LAB — POCT GLYCOSYLATED HEMOGLOBIN (HGB A1C): Hemoglobin A1C: 7.8 % — AB (ref 4.0–5.6)

## 2018-01-09 LAB — GLUCOSE, CAPILLARY: Glucose-Capillary: 139 mg/dL — ABNORMAL HIGH (ref 70–99)

## 2018-01-09 MED ORDER — LISINOPRIL 10 MG PO TABS: 10.0000 mg | ORAL_TABLET | Freq: Every day | ORAL | 1 refills | Status: DC

## 2018-01-09 NOTE — Patient Instructions (Signed)
Ms. Rieger, It was a pleasure meeting you.   For you Diabetes: We will continue the insulin 70/30 like you've been taking. If you can, try to check your blood sugars twice a day so we can get a better idea of how they're doing. We will follow-up with you in a month to see if further adjustments need to be made.   For your blood pressure: I am starting Lisinopril 10 mg for you to take once a day. We will re-check when you are here in a month.    For sleep: you can try taking melatonin 5 or 10 mg every night.   For joint pain: Try taking Aleeve twice a day on a consistent basis, and continue with the icy hot and other topical creams. The exercises you learned at physical therapy are also great to keep the joints moving.   I am running several labs to see if there is an explanation for your fatigue and will let you know these results.   Take care, and look forward to seeing you in a month!

## 2018-01-10 LAB — CBC WITH DIFFERENTIAL/PLATELET
BASOS ABS: 0 10*3/uL (ref 0.0–0.2)
Basos: 0 %
EOS (ABSOLUTE): 0.2 10*3/uL (ref 0.0–0.4)
Eos: 2 %
Hematocrit: 40.1 % (ref 34.0–46.6)
Hemoglobin: 13.5 g/dL (ref 11.1–15.9)
IMMATURE GRANULOCYTES: 0 %
Immature Grans (Abs): 0 10*3/uL (ref 0.0–0.1)
LYMPHS: 24 %
Lymphocytes Absolute: 1.8 10*3/uL (ref 0.7–3.1)
MCH: 28.7 pg (ref 26.6–33.0)
MCHC: 33.7 g/dL (ref 31.5–35.7)
MCV: 85 fL (ref 79–97)
Monocytes Absolute: 0.5 10*3/uL (ref 0.1–0.9)
Monocytes: 7 %
NEUTROS PCT: 67 %
Neutrophils Absolute: 4.9 10*3/uL (ref 1.4–7.0)
PLATELETS: 386 10*3/uL (ref 150–450)
RBC: 4.71 x10E6/uL (ref 3.77–5.28)
RDW: 13 % (ref 12.3–15.4)
WBC: 7.3 10*3/uL (ref 3.4–10.8)

## 2018-01-10 LAB — MICROALBUMIN / CREATININE URINE RATIO
Creatinine, Urine: 29.8 mg/dL
MICROALB/CREAT RATIO: 55.7 mg/g{creat} — AB (ref 0.0–30.0)
MICROALBUM., U, RANDOM: 16.6 ug/mL

## 2018-01-10 LAB — VITAMIN D 25 HYDROXY (VIT D DEFICIENCY, FRACTURES): VIT D 25 HYDROXY: 37.6 ng/mL (ref 30.0–100.0)

## 2018-01-10 LAB — TSH: TSH: 1.88 u[IU]/mL (ref 0.450–4.500)

## 2018-01-10 LAB — HEPATITIS C ANTIBODY: HEP C VIRUS AB: 0.1 {s_co_ratio} (ref 0.0–0.9)

## 2018-01-12 ENCOUNTER — Encounter: Payer: Self-pay | Admitting: Internal Medicine

## 2018-01-12 DIAGNOSIS — R5383 Other fatigue: Secondary | ICD-10-CM | POA: Insufficient documentation

## 2018-01-12 NOTE — Progress Notes (Signed)
   CC: DM, fatigue   HPI:  [name] is a 57 y.o. female with PMHx listed below who presents for follow-up on her chronic medical conditions.   Diabetes: Patient currently only checking blood sugar when she feels like it is too low or too high. She states she has not had a hypoglycemic episode for approximately 3-4 months. Current regimen is Humulin 70/30 17 units in the morning and 15 units in the evening.   Fatigue: She complains of daily fatigue for several months. Endorses a difficult time sleeping at night and only sleeps 3-4 hours/night. Feels it may be due to her weight, as she has gained approximately 25 lbs in the last year. Unfortunately, she also struggles with joint pain which makes it difficult for her to exercise. Discussed diet modifications and low impact exercises like water aerobics that she would benefit from.   HTN: Has been on Lisinopril in the past but is not currently on any anti-hypertensives. She denies headaches, vision changes, chest pain, shortness of breath.   For further details of today's visit and the status of her chronic medical issues please refer to the assessment and plan.    Past Medical History:  Diagnosis Date  . Daily headache    "for the past 2-3 wks" (07/02/2012)  . Depression   . Dysrhythmia   . Eclampsia 1988   stroke like symtoms with pregnancy, h/o protenuria with eclampsia   . Heart murmur 1988   "I had eclampsia; they said I had a stroke" (07/02/2012)  . Hidradenitis suppurativa   . History of juvenile arthritis    diagnosed age 78 y.o; affects hands, knees   . Hypertension   . Hypothyroid   . Kidney stones    "once" (07/02/2012)  . Migraines    "occasionally" (07/02/2012)  . Obesity   . Peripheral neuropathy (HCC)    As of 07/2012 patient reports she was dx'ed 25 yrs ago previously taking Neurontin and B12 shot   . Pneumonia ~ 1991   "once" (07/02/2012)  . Tachycardia    Review of Systems:    Constitutional: positive for fatigue;  negative for fevers, chills, changes in weight CV: negative for chest pain or palpitations MSK: positive for joint pain   Physical Exam:  Vitals:   01/09/18 1429  BP: (!) 151/75  Pulse: 86  Temp: 98.7 F (37.1 C)  TempSrc: Oral  SpO2: 98%  Weight: 259 lb 14.4 oz (117.9 kg)  Height: 5\' 3"  (1.6 m)   General: alert, morbidly obese female, appears stated age, NAD CV: RRR; no murmurs, rubs or gallops Pulm: normal respiratory effort; lungs CTA bilaterally MSK: R shoulder joint with TTP along anterior and lateral portions. Painful elicited with active abduction. Strength is 5/5.  Psych: blunted affect; appears apathetic   Assessment & Plan:   See Encounters Tab for problem based charting.  Patient seen with Dr. Heide Spark

## 2018-01-12 NOTE — Assessment & Plan Note (Signed)
Likely multifactorial from deconditioning, obesity and depression. Patient is not interested in medical management of Depression at this time, despite advising that this may be contributing to her fatigue. She denies current SI/HI. Will continue to monitor with PHQ-9 screenings. Counseled her on lifestyle modifications to help with weight loss, as well as good sleep hygiene. Advised she could also take Melatonin to help with sleep.  TSH, Vit D, CBC were all normal.  Will monitor at follow-up visit in 1 month.

## 2018-01-12 NOTE — Assessment & Plan Note (Signed)
Patient's blood pressure has now been elevated on repeated visits to [facility]. She has been on Lisinopril in the past. Will start Lisinopril 10 mg today and follow-up in one month for BP re-check and BMP.

## 2018-01-12 NOTE — Assessment & Plan Note (Addendum)
Patient's A1C improved from 8.1 to 7.8. Encouraged her to check blood sugars more frequently so we can better evaluate if she needs escalation in her therapy. She was started on Glipizide last visit, but has not been taking it. Do not want to restart today without knowing how CBGs range, as it can cause hypoglycemia. Will bring her back in a month to review blood glucose levels. Continue 70/30 insulin of 17 units in the morning and 15 units in the evening.

## 2018-01-13 NOTE — Progress Notes (Signed)
Internal Medicine Clinic Attending  I saw and evaluated the patient.  I personally confirmed the key portions of the history and exam documented by Dr. Bloomfield and I reviewed pertinent patient test results.  The assessment, diagnosis, and plan were formulated together and I agree with the documentation in the resident's note.  

## 2018-04-15 ENCOUNTER — Encounter: Payer: Self-pay | Admitting: Internal Medicine

## 2018-05-29 ENCOUNTER — Encounter: Payer: Self-pay | Admitting: Internal Medicine

## 2018-06-09 ENCOUNTER — Encounter: Payer: Self-pay | Admitting: Internal Medicine

## 2019-04-03 HISTORY — PX: TOOTH EXTRACTION: SUR596

## 2020-08-30 ENCOUNTER — Ambulatory Visit: Payer: Self-pay | Admitting: Podiatry

## 2020-09-05 ENCOUNTER — Ambulatory Visit: Payer: Medicaid Other | Attending: Family Medicine | Admitting: Physical Therapy

## 2020-09-05 ENCOUNTER — Other Ambulatory Visit: Payer: Self-pay

## 2020-09-05 ENCOUNTER — Encounter: Payer: Self-pay | Admitting: Physical Therapy

## 2020-09-05 DIAGNOSIS — Z9181 History of falling: Secondary | ICD-10-CM

## 2020-09-05 DIAGNOSIS — M6281 Muscle weakness (generalized): Secondary | ICD-10-CM | POA: Insufficient documentation

## 2020-09-05 DIAGNOSIS — R2689 Other abnormalities of gait and mobility: Secondary | ICD-10-CM | POA: Diagnosis present

## 2020-09-05 DIAGNOSIS — R2681 Unsteadiness on feet: Secondary | ICD-10-CM | POA: Insufficient documentation

## 2020-09-05 NOTE — Therapy (Signed)
[facility] [address] [place], [place], [address] Phone: [phone]   Fax:  [phone]  Physical Therapy Evaluation  Patient Details  Name: Melanie Henry MRN: [id number] Date of Birth: March 13, 1961 Referring Provider (PT): Renaye Rakers, MD   Encounter Date: 09/05/2020   PT End of Session - 09/05/20 1651    Visit Number 1    Number of Visits 12   1x week for 3 weeks followed by 2x week for 4 weeks   Date for PT Re-Evaluation 11/04/20    Authorization Type Medicaid    PT Start Time 1532    PT Stop Time 1615    PT Time Calculation (min) 43 min    Equipment Utilized During Treatment Gait belt    Activity Tolerance Patient tolerated treatment well    Behavior During Therapy [facility] for tasks assessed/performed           Past Medical History:  Diagnosis Date  . Daily headache    "for the past 2-3 wks" (07/02/2012)  . Depression   . Dysrhythmia   . Eclampsia 1988   stroke like symtoms with pregnancy, h/o protenuria with eclampsia   . Heart murmur 1988   "I had eclampsia; they said I had a stroke" (07/02/2012)  . Hidradenitis suppurativa   . History of juvenile arthritis    diagnosed age 58 y.o; affects hands, knees   . Hypertension   . Hypothyroid   . Kidney stones    "once" (07/02/2012)  . Migraines    "occasionally" (07/02/2012)  . Obesity   . Peripheral neuropathy    As of 07/2012 patient reports she was dx'ed 25 yrs ago previously taking Neurontin and B12 shot   . Pneumonia ~ 1991   "once" (07/02/2012)  . Tachycardia     Past Surgical History:  Procedure Laterality Date  . ANKLE FUSION Right 1980's   "pinned" (07/02/2012)  . CESAREAN SECTION  1988; 1990  . THUMB FUSION Right 1970's   "pinned; reattached tendon" (07/02/2012)  . TONSILLECTOMY  1980  . TUBAL LIGATION  03/1989; 06/1990    There were no vitals filed for this visit.    Subjective Assessment - 09/05/20 1538    Subjective Has been falling for years.  It is worse now because she falls and can't get up without assistance. Reports feeling weaker .Reports various joints give out from time to time and her "joints will go loose". Reports no dizziness or lightheadedness. Keeps a cane with her and uses it sometimes, like on uneven surfaces or for a big open area where she can't hold onto a countertop or a wall. Has about 4-5 falls in the past 6 months. Has almost falls/stumbles everyday. Lives with her 69 year old dad.    Pertinent History PMH: HTN, peripheral neuropathy, type 2 diabetes    Limitations Walking;Standing    Patient Stated Goals wants to build up her strength, find some safe exercises.    Currently in Pain? Other (Comment)   reports everywhere feels stiff and shoulders are a little achy             [facility] PT Assessment - 09/05/20 1546      Assessment   Medical Diagnosis repeated falls    Referring Provider (PT) Renaye Rakers, MD    Onset Date/Surgical Date 08/16/20    Hand Dominance Right    Prior Therapy PT at this location in 2016      Precautions   Precautions Fall  Balance Screen   Has the patient fallen in the past 6 months Yes    How many times? 4-5    Has the patient had a decrease in activity level because of a fear of falling?  Yes    Is the patient reluctant to leave their home because of a fear of falling?  No   doesn't do activities if she has to stand or walk for longer periods     Home Environment   Living Environment Private residence    Living Arrangements Parent   81 year dad   Type of Home House    Home Access Stairs to enter    Entrance Stairs-Number of Steps 1    Home Layout One level    Home Equipment Niotaze - single point;Walker - standard    Additional Comments reports doesn't do stairs very well      Prior Function   Level of Independence Independent    Vocation Unemployed    Leisure likes reading and going to [employer], hanging out with her grandkids      Sensation   Light Touch  Impaired Detail   difficulty detecting B lateral ankles   Additional Comments has N/T in feet due to peripheral neuropathy      Coordination   Gross Motor Movements are Fluid and Coordinated Yes      ROM / Strength   AROM / PROM / Strength Strength      Strength   Strength Assessment Site Hip;Knee;Ankle    Right/Left Hip Right;Left    Right Hip Flexion 4-/5    Left Hip Flexion 4/5    Right/Left Knee Right;Left    Right Knee Flexion 4/5    Right Knee Extension 4+/5    Left Knee Flexion 5/5    Left Knee Extension 4+/5    Right/Left Ankle Right;Left    Right Ankle Dorsiflexion 4+/5    Left Ankle Dorsiflexion 4+/5      Transfers   Transfers Sit to Stand;Stand to Sit    Sit to Stand 5: Supervision;Without upper extremity assist;From chair/3-in-1    Five time sit to stand comments  30.13 seconds without UE support from standard height chair    Stand to Sit 5: Supervision      Ambulation/Gait   Ambulation/Gait Yes    Ambulation/Gait Assistance 5: Supervision    Ambulation Distance (Feet) --   clinic distances during eval   Assistive device None    Gait Pattern Step-through pattern;Decreased stance time - right;Decreased hip/knee flexion - right;Decreased hip/knee flexion - left   R toe out   Ambulation Surface Level;Indoor    Gait velocity 15.65 seconds = 2.09 ft/sec    Stairs Yes    Stairs Assistance 5: Supervision    Stair Management Technique Two rails;Step to pattern;Forwards    Number of Stairs 4    Height of Stairs 6      Standardized Balance Assessment   Standardized Balance Assessment Timed Up and Go Test      Timed Up and Go Test   Normal TUG (seconds) 12.69      Functional Gait  Assessment   Gait assessed  Yes    Gait Level Surface Walks 20 ft, slow speed, abnormal gait pattern, evidence for imbalance or deviates 10-15 in outside of the 12 in walkway width. Requires more than 7 sec to ambulate 20 ft.   9.15   Change in Gait Speed Makes only minor adjustments to  walking speed, or  accomplishes a change in speed with significant gait deviations, deviates 10-15 in outside the 12 in walkway width, or changes speed but loses balance but is able to recover and continue walking.    Gait with Horizontal Head Turns Performs head turns with moderate changes in gait velocity, slows down, deviates 10-15 in outside 12 in walkway width but recovers, can continue to walk.    Gait with Vertical Head Turns Performs task with slight change in gait velocity (eg, minor disruption to smooth gait path), deviates 6 - 10 in outside 12 in walkway width or uses assistive device    Gait and Pivot Turn Pivot turns safely in greater than 3 sec and stops with no loss of balance, or pivot turns safely within 3 sec and stops with mild imbalance, requires small steps to catch balance.    Step Over Obstacle Is able to step over one shoe box (4.5 in total height) but must slow down and adjust steps to clear box safely. May require verbal cueing.    Gait with Narrow Base of Support Is able to ambulate for 10 steps heel to toe with no staggering.    Gait with Eyes Closed Walks 20 ft, slow speed, abnormal gait pattern, evidence for imbalance, deviates 10-15 in outside 12 in walkway width. Requires more than 9 sec to ambulate 20 ft.   11.35   Ambulating Backwards Walks 20 ft, slow speed, abnormal gait pattern, evidence for imbalance, deviates 10-15 in outside 12 in walkway width.   23.84   Steps Two feet to a stair, must use rail.    Total Score 14    FGA comment: 14/30                      Objective measurements completed on examination: See above findings.               PT Education - 09/05/20 1651    Education Details clinical findings, POC, medicaid visit limit.    Person(s) Educated Patient    Methods Explanation    Comprehension Verbalized understanding            PT Short Term Goals - 09/05/20 2118      PT SHORT TERM GOAL #1   Title Pt will be  independent in initial HEP in order to build upon functional gains made in therapy. ALL STGS DUE AFTER 3 VISITS OR BY 10/03/20    Baseline currently dependent.    Time 4   VISITS   Period Weeks    Status New    Target Date 10/03/20      PT SHORT TERM GOAL #2   Title Pt will decr 5x sit <> stand to 26 seconds or less without BUE support in order to demo improved strength and balance.    Baseline 30.13 seconds without UE support from chair    Time 4    Period Weeks    Status New      PT SHORT TERM GOAL #3   Title Pt will improve FGA score to at least a 17/30 in order to demo decr fall risk.    Baseline 14/30    Time 4    Period Weeks    Status New             PT Long Term Goals - 09/05/20 2120      PT LONG TERM GOAL #1   Title Pt will be independent in final HEP in order  to build upon functional gains made in therapy. ALL STGS DUE AFTER 12 VISITS OR BY 11/05/20    Baseline currently dependent.    Time 8    Period Weeks    Status New    Target Date 11/05/20      PT LONG TERM GOAL #2   Title Pt will ambulate at least 250' outdoors over paved/grass surfaces with LRAD vs. no AD in order  to demo improved community mobility.    Baseline not yet assessed.    Time 8    Period Weeks    Status New      PT LONG TERM GOAL #3   Title Pt will improve gait speed with no AD vs. LRAD to at least 2.6 ft/sec in order to demo improved community mobility.    Baseline 2.09 ft/sec with no AD    Time 8    Period Weeks    Status New      PT LONG TERM GOAL #4   Title Pt will improve FGA score to at least a 20/30 in order to demo decr fall risk.    Baseline 14/30 ON 09/05/20    Time 8    Period Weeks    Status New      PT LONG TERM GOAL #5   Title Pt will decr 5x sit <> stand to 22 seconds or less without BUE support in order to demo improved strength and balance.    Baseline 30.13 seconds without UE support from chair    Time 8    Period Weeks    Status New                   Plan - 09/05/20 1653    Clinical Impression Statement Patient is a 60 year old female referred to Neuro OPPT for repeated falls. Pt's PMH is significant for: PMH: HTN, peripheral neuropathy, type 2 diabetes, hx of juvenile arthritis. The following deficits were present during the exam:  decr strength, impaired balance, decr endurance, gait abnormalities, impaired sensation. Based on 5x sit <> stand and FGA pt is an incr risk for falls. Pt's gait speed of 2.09 ft/sec with no AD indicates that pt is a limited community ambulator. Pt would benefit from skilled PT to address these impairments and functional limitations to maximize functional mobility independence and decr fall risk.    Personal Factors and Comorbidities Past/Current Experience;Comorbidity 3+;Time since onset of injury/illness/exacerbation    Comorbidities PMH: HTN, peripheral neuropathy, type 2 diabetes    Examination-Activity Limitations Locomotion Level;Transfers;Squat;Stairs;Stand    Examination-Participation Restrictions Community Activity;Shop    Stability/Clinical Decision Making Stable/Uncomplicated    Clinical Decision Making Low    Rehab Potential Good    PT Frequency 1x / week   followed by 2x week for 4 weeks   PT Duration 3 weeks   followed by 2x week for 4 weeks   PT Treatment/Interventions ADLs/Self Care Home Management;DME Instruction;Gait training;Stair training;Functional mobility training;Therapeutic activities;Therapeutic exercise;Balance training;Patient/family education;Neuromuscular re-education    PT Next Visit Plan initial HEP for strengthening/balance (eyes closed, retro gait, SLS, gait with head motions). assess mCTSIB for balance on unlevel surfaces.    Consulted and Agree with Plan of Care Patient           Patient will benefit from skilled therapeutic intervention in order to improve the following deficits and impairments:  Abnormal gait,Decreased balance,Decreased endurance,Decreased activity  tolerance,Difficulty walking,Decreased strength,Impaired sensation  Visit Diagnosis: Unsteadiness on feet  History of falling  Other abnormalities of gait and mobility  Muscle weakness (generalized)     Problem List Patient Active Problem List   Diagnosis Date Noted  . Fatigue 01/12/2018  . Pain due to dental caries 07/22/2015  . Hypertension 06/29/2014  . Shoulder pain, left 02/18/2014  . Depression 02/18/2014  . HLD (hyperlipidemia) 11/19/2013  . Other malaise and fatigue 11/12/2013  . Hidradenitis suppurativa 12/18/2012  . Health care maintenance 07/08/2012  . Abnormality of gait 07/08/2012  . Poorly controlled type 2 diabetes mellitus with peripheral neuropathy (HCC) 07/03/2012  . Morbid obesity (HCC) 07/03/2012  . Tobacco abuse 07/03/2012    Drake Leachhloe N Jaedynn Bohlken 09/05/2020, 9:25 PM  [facility] [address] [place], [place], [address] Phone: [phone]   Fax:  [phone]  Name: Sammie BenchZoe Henry MRN: [id number] Date of Birth: 12/09/1960

## 2020-09-20 ENCOUNTER — Ambulatory Visit: Payer: Medicaid Other | Admitting: Physical Therapy

## 2020-09-20 ENCOUNTER — Other Ambulatory Visit: Payer: Self-pay

## 2020-09-20 ENCOUNTER — Encounter: Payer: Self-pay | Admitting: Physical Therapy

## 2020-09-20 DIAGNOSIS — R2681 Unsteadiness on feet: Secondary | ICD-10-CM | POA: Diagnosis not present

## 2020-09-20 DIAGNOSIS — Z9181 History of falling: Secondary | ICD-10-CM

## 2020-09-20 DIAGNOSIS — R2689 Other abnormalities of gait and mobility: Secondary | ICD-10-CM

## 2020-09-20 NOTE — Patient Instructions (Signed)
Access Code: [email or web address] URL: [email or web address] Date: 09/20/2020 Prepared by: Sherlie Ban  Exercises Sit to Stand - 2 x daily - 5 x weekly - 2 sets - 5 reps Backward Walking with Counter Support - 2 x daily - 5 x weekly - 3 sets Standing Marching - 2 x daily - 5 x weekly - 3 sets Standing Balance with Eyes Closed on Foam - 2 x daily - 5 x weekly - 3 sets - 30 hold Romberg Stance on Foam Pad - 1-2 x daily - 5 x weekly - 2 sets - 10 reps

## 2020-09-20 NOTE — Therapy (Signed)
[facility] [address] [place], [place], [address] Phone: [phone]   Fax:  [phone]  Physical Therapy Treatment  Patient Details  Name: Brandace Cargle MRN: [id number] Date of Birth: 1961-03-21 Referring Provider (PT): Renaye Rakers, MD   Encounter Date: 09/20/2020   PT End of Session - 09/20/20 1228     Visit Number 2    Number of Visits 12   1x week for 3 weeks followed by 2x week for 4 weeks   Date for PT Re-Evaluation 11/04/20    Authorization Type Medicaid    PT Start Time 1146    PT Stop Time 1226    PT Time Calculation (min) 40 min    Equipment Utilized During Treatment Gait belt    Activity Tolerance Patient tolerated treatment well    Behavior During Therapy [facility] for tasks assessed/performed             Past Medical History:  Diagnosis Date   Daily headache    "for the past 2-3 wks" (07/02/2012)   Depression    Dysrhythmia    Eclampsia 1988   stroke like symtoms with pregnancy, h/o protenuria with eclampsia    Heart murmur 1988   "I had eclampsia; they said I had a stroke" (07/02/2012)   Hidradenitis suppurativa    History of juvenile arthritis    diagnosed age 89 y.o; affects hands, knees    Hypertension    Hypothyroid    Kidney stones    "once" (07/02/2012)   Migraines    "occasionally" (07/02/2012)   Obesity    Peripheral neuropathy    As of 07/2012 patient reports she was dx'ed 25 yrs ago previously taking Neurontin and B12 shot    Pneumonia ~ 1991   "once" (07/02/2012)   Tachycardia     Past Surgical History:  Procedure Laterality Date   ANKLE FUSION Right 1980's   "pinned" (07/02/2012)   CESAREAN SECTION  1988; 1990   THUMB FUSION Right 1970's   "pinned; reattached tendon" (07/02/2012)   TONSILLECTOMY  1980   TUBAL LIGATION  03/1989; 06/1990    There were no vitals filed for this visit.   Subjective Assessment - 09/20/20 1149     Subjective No falls, just a couple of stumbles.  Reports L knee is acting up this morning and brought in her cane.    Pertinent History PMH: HTN, peripheral neuropathy, type 2 diabetes    Limitations Walking;Standing    Patient Stated Goals wants to build up her strength, find some safe exercises.    Currently in Pain? Yes    Pain Score 3     Pain Location Knee    Pain Orientation Left    Pain Descriptors / Indicators Aching;Sharp   stiff   Pain Type Chronic pain    Aggravating Factors  walking on it.    Pain Relieving Factors icy hot.                [facility] PT Assessment - 09/20/20 1152       High Level Balance   High Level Balance Comments mCTSIB: conditons 1-4 = 30 seconds each, mild postural sway with 2 and 3, moderate with condition 4                     Access Code: [email or web address] URL: [email or web address] Date: 09/20/2020 Prepared by: Sherlie Ban  Initiated HEP for strength/balance, see MedBridge for further details.   Exercises Sit to Stand -  2 x daily - 5 x weekly - 2 sets - 5 reps Backward Walking with Counter Support - 2 x daily - 5 x weekly - 3 sets Standing Marching - 2 x daily - 5 x weekly - 3 sets Standing Balance with Eyes Closed on Foam - 2 x daily - 5 x weekly - 3 sets - 30 hold Romberg Stance on Foam Pad - 1-2 x daily - 5 x weekly - 2 sets - 10 reps           Balance Exercises - 09/20/20 1225       Balance Exercises: Standing   Standing Eyes Opened Foam/compliant surface;Limitations    Standing Eyes Opened Limitations on blue air ex, feet hip width distance, x10 reps head turns, x10 reps head nods    Stepping Strategy Anterior;Posterior;Limitations    Stepping Strategy Limitations alternating stepping off blue air ex, x10 reps each direction, incr difficulty with forward stepping               PT Education - 09/20/20 1227     Education Details results of mCTSIB, initial HEP for strength/balance    Person(s) Educated Patient    Methods  Explanation;Demonstration;Handout    Comprehension Verbalized understanding;Returned demonstration              PT Short Term Goals - 09/05/20 2118       PT SHORT TERM GOAL #1   Title Pt will be independent in initial HEP in order to build upon functional gains made in therapy. ALL STGS DUE AFTER 3 VISITS OR BY 10/03/20    Baseline currently dependent.    Time 4   VISITS   Period Weeks    Status New    Target Date 10/03/20      PT SHORT TERM GOAL #2   Title Pt will decr 5x sit <> stand to 26 seconds or less without BUE support in order to demo improved strength and balance.    Baseline 30.13 seconds without UE support from chair    Time 4    Period Weeks    Status New      PT SHORT TERM GOAL #3   Title Pt will improve FGA score to at least a 17/30 in order to demo decr fall risk.    Baseline 14/30    Time 4    Period Weeks    Status New               PT Long Term Goals - 09/05/20 2120       PT LONG TERM GOAL #1   Title Pt will be independent in final HEP in order to build upon functional gains made in therapy. ALL STGS DUE AFTER 12 VISITS OR BY 11/05/20    Baseline currently dependent.    Time 8    Period Weeks    Status New    Target Date 11/05/20      PT LONG TERM GOAL #2   Title Pt will ambulate at least 250' outdoors over paved/grass surfaces with LRAD vs. no AD in order  to demo improved community mobility.    Baseline not yet assessed.    Time 8    Period Weeks    Status New      PT LONG TERM GOAL #3   Title Pt will improve gait speed with no AD vs. LRAD to at least 2.6 ft/sec in order to demo improved community mobility.    Baseline  2.09 ft/sec with no AD    Time 8    Period Weeks    Status New      PT LONG TERM GOAL #4   Title Pt will improve FGA score to at least a 20/30 in order to demo decr fall risk.    Baseline 14/30 ON 09/05/20    Time 8    Period Weeks    Status New      PT LONG TERM GOAL #5   Title Pt will decr 5x sit <> stand to 22  seconds or less without BUE support in order to demo improved strength and balance.    Baseline 30.13 seconds without UE support from chair    Time 8    Period Weeks    Status New                   Plan - 09/20/20 1229     Clinical Impression Statement Performed the mCTSIB, pt able to hold for 30 seconds for all conditions, however had moderate postural sway with condition 4, indicating decr vestibular input for balance. Initiated HEP today for balance strategies with no issues noted. Will continue to progress towards LTGs.    Personal Factors and Comorbidities Past/Current Experience;Comorbidity 3+;Time since onset of injury/illness/exacerbation    Comorbidities PMH: HTN, peripheral neuropathy, type 2 diabetes    Examination-Activity Limitations Locomotion Level;Transfers;Squat;Stairs;Stand    Examination-Participation Restrictions Community Activity;Shop    Stability/Clinical Decision Making Stable/Uncomplicated    Rehab Potential Good    PT Frequency 1x / week   followed by 2x week for 4 weeks   PT Duration 3 weeks   followed by 2x week for 4 weeks   PT Treatment/Interventions ADLs/Self Care Home Management;DME Instruction;Gait training;Stair training;Functional mobility training;Therapeutic activities;Therapeutic exercise;Balance training;Patient/family education;Neuromuscular re-education    PT Next Visit Plan how was HEP? work on balance - (eyes closed, retro gait, SLS, gait with head motions, compliant surfaces). functional BLE strength.    PT Home Exercise Plan [email or web address]    Consulted and Agree with Plan of Care Patient             Patient will benefit from skilled therapeutic intervention in order to improve the following deficits and impairments:  Abnormal gait, Decreased balance, Decreased endurance, Decreased activity tolerance, Difficulty walking, Decreased strength, Impaired sensation  Visit Diagnosis: Unsteadiness on feet  History of falling  Other  abnormalities of gait and mobility     Problem List Patient Active Problem List   Diagnosis Date Noted   Fatigue 01/12/2018   Pain due to dental caries 07/22/2015   Hypertension 06/29/2014   Shoulder pain, left 02/18/2014   Depression 02/18/2014   HLD (hyperlipidemia) 11/19/2013   Other malaise and fatigue 11/12/2013   Hidradenitis suppurativa 12/18/2012   Health care maintenance 07/08/2012   Abnormality of gait 07/08/2012   Poorly controlled type 2 diabetes mellitus with peripheral neuropathy (HCC) 07/03/2012   Morbid obesity (HCC) 07/03/2012   Tobacco abuse 07/03/2012    Drake Leach, PT, DPT  09/20/2020, 12:30 PM  [facility] [address] [place], [place], [address] Phone: [phone]   Fax:  [phone]  Name: Maila Dukes MRN: [id number] Date of Birth: Jun 03, 1960

## 2020-09-27 ENCOUNTER — Ambulatory Visit: Payer: Medicaid Other | Admitting: Physical Therapy

## 2020-09-30 ENCOUNTER — Ambulatory Visit: Payer: Medicaid Other | Admitting: Physical Therapy

## 2020-10-04 ENCOUNTER — Ambulatory Visit: Payer: Medicaid Other | Attending: Family Medicine | Admitting: Physical Therapy

## 2020-10-04 ENCOUNTER — Other Ambulatory Visit: Payer: Self-pay

## 2020-10-04 DIAGNOSIS — R2681 Unsteadiness on feet: Secondary | ICD-10-CM | POA: Diagnosis not present

## 2020-10-04 DIAGNOSIS — R2689 Other abnormalities of gait and mobility: Secondary | ICD-10-CM | POA: Diagnosis present

## 2020-10-04 DIAGNOSIS — M6281 Muscle weakness (generalized): Secondary | ICD-10-CM | POA: Diagnosis present

## 2020-10-04 NOTE — Therapy (Signed)
[facility] [address] [place], [place], [address] Phone: [phone]   Fax:  [phone]  Physical Therapy Treatment  Patient Details  Name: Melanie Henry MRN: [id number] Date of Birth: 04/22/1960 Referring Provider (PT): Lucianne Lei, MD   Encounter Date: 10/04/2020   PT End of Session - 10/04/20 1447     Visit Number 3    Number of Visits 12   1x week for 3 weeks followed by 2x week for 4 weeks   Date for PT Re-Evaluation 11/04/20    Authorization Type Medicaid    PT Start Time 1406    PT Stop Time 9983    PT Time Calculation (min) 39 min    Equipment Utilized During Treatment Gait belt    Activity Tolerance Patient tolerated treatment well    Behavior During Therapy [facility] for tasks assessed/performed             Past Medical History:  Diagnosis Date   Daily headache    "for the past 2-3 wks" (07/02/2012)   Depression    Dysrhythmia    Eclampsia 1988   stroke like symtoms with pregnancy, h/o protenuria with eclampsia    Heart murmur 1988   "I had eclampsia; they said I had a stroke" (07/02/2012)   Hidradenitis suppurativa    History of juvenile arthritis    diagnosed age 69 y.o; affects hands, knees    Hypertension    Hypothyroid    Kidney stones    "once" (07/02/2012)   Migraines    "occasionally" (07/02/2012)   Obesity    Peripheral neuropathy    As of 07/2012 patient reports she was dx'ed 25 yrs ago previously taking Neurontin and B12 shot    Pneumonia ~ 1991   "once" (07/02/2012)   Tachycardia     Past Surgical History:  Procedure Laterality Date   ANKLE FUSION Right 1980's   "pinned" (07/02/2012)   [place]; [place] Right 1970's   "pinned; reattached tendon" (07/02/2012)   TONSILLECTOMY  1980   TUBAL LIGATION  03/1989; 06/1990    There were no vitals filed for this visit.   Subjective Assessment - 10/04/20 1410     Subjective No more pain than normal.  Didn't come last  week due to a cyst on buttock area-spent most of last week lying down.  No falls.    Pertinent History PMH: HTN, peripheral neuropathy, type 2 diabetes    Limitations Walking;Standing    Patient Stated Goals wants to build up her strength, find some safe exercises.    Currently in Pain? Yes   same pain as usual in joints               [facility] PT Assessment - 10/04/20 0001       Functional Gait  Assessment   Gait assessed  Yes    Gait Level Surface Walks 20 ft, slow speed, abnormal gait pattern, evidence for imbalance or deviates 10-15 in outside of the 12 in walkway width. Requires more than 7 sec to ambulate 20 ft.   8.4   Change in Gait Speed Makes only minor adjustments to walking speed, or accomplishes a change in speed with significant gait deviations, deviates 10-15 in outside the 12 in walkway width, or changes speed but loses balance but is able to recover and continue walking.   9.4   Gait with Horizontal Head Turns Performs head turns with moderate changes in gait  velocity, slows down, deviates 10-15 in outside 12 in walkway width but recovers, can continue to walk.    Gait with Vertical Head Turns Performs task with slight change in gait velocity (eg, minor disruption to smooth gait path), deviates 6 - 10 in outside 12 in walkway width or uses assistive device    Gait and Pivot Turn Pivot turns safely in greater than 3 sec and stops with no loss of balance, or pivot turns safely within 3 sec and stops with mild imbalance, requires small steps to catch balance.   3.06   Step Over Obstacle Is able to step over one shoe box (4.5 in total height) but must slow down and adjust steps to clear box safely. May require verbal cueing.    Gait with Narrow Base of Support Is able to ambulate for 10 steps heel to toe with no staggering.    Gait with Eyes Closed Walks 20 ft, slow speed, abnormal gait pattern, evidence for imbalance, deviates 10-15 in outside 12 in walkway width. Requires more  than 9 sec to ambulate 20 ft.   10sec   Ambulating Backwards Walks 20 ft, uses assistive device, slower speed, mild gait deviations, deviates 6-10 in outside 12 in walkway width.   15.85   Steps Alternating feet, must use rail.    Total Score 16    FGA comment: Improved from 14/30               Access Code: [email or web address] URL: [email or web address] Date: 10/04/2020 Prepared by: Mady Haagensen  Exercises-Reviewed as HEP given last visit.  Pt return demo understanding. Sit to Stand - 2 x daily - 5 x weekly - 2 sets - 5 reps Backward Walking with Counter Support - 2 x daily - 5 x weekly - 3 sets Standing Marching - 2 x daily - 5 x weekly - 3 sets Standing Balance with Eyes Closed on Foam - 2 x daily - 5 x weekly - 3 sets - 30 hold Romberg Stance on Foam Pad - 1-2 x daily - 5 x weekly - 2 sets - 10 reps             [facility] Adult PT Treatment/Exercise - 10/04/20 0001       Transfers   Transfers Sit to Stand;Stand to Sit    Sit to Stand 5: Supervision;Without upper extremity assist;From chair/3-in-1    Five time sit to stand comments  24.22   no UE support   Stand to Sit 5: Supervision                 Balance Exercises - 10/04/20 0001       Balance Exercises: Standing   Wall Bumps Hip;10 reps;Limitations    Wall Bumps Limitations In corner, chair support as needed    Gait with Head Turns Forward;Intermittent upper extremity support;3 reps;Limitations    Gait with Head Turns Limitations Along counter    Partial Tandem Stance Eyes open;Eyes closed;Upper extremity support 1;Limitations    Partial Tandem Stance Limitations Head turns/nods x 5 reps with EO, then EC head steady x 15 seconds; each foot position.    Sidestepping 3 reps;Limitations    Sidestepping Limitations R and L along counter    Heel Raises Both;10 reps    Toe Raise Both;10 reps                 PT Short Term Goals - 10/04/20 1422       PT SHORT TERM GOAL #1  Title Pt will be  independent in initial HEP in order to build upon functional gains made in therapy. ALL STGS DUE AFTER 3 VISITS OR BY 10/03/20    Baseline currently dependent.    Time 4   VISITS   Period Weeks    Status Achieved    Target Date 10/03/20      PT SHORT TERM GOAL #2   Title Pt will decr 5x sit <> stand to 26 seconds or less without BUE support in order to demo improved strength and balance.    Baseline 30.13 seconds without UE support from chair; 24.22 sec 10/04/20    Time 4    Period Weeks    Status Achieved      PT SHORT TERM GOAL #3   Title Pt will improve FGA score to at least a 17/30 in order to demo decr fall risk.    Baseline 14/30, 16/30 10/04/2020    Time 4    Period Weeks    Status Partially Met               PT Long Term Goals - 09/05/20 2120       PT LONG TERM GOAL #1   Title Pt will be independent in final HEP in order to build upon functional gains made in therapy. ALL STGS DUE AFTER 12 VISITS OR BY 11/05/20    Baseline currently dependent.    Time 8    Period Weeks    Status New    Target Date 11/05/20      PT LONG TERM GOAL #2   Title Pt will ambulate at least 250' outdoors over paved/grass surfaces with LRAD vs. no AD in order  to demo improved community mobility.    Baseline not yet assessed.    Time 8    Period Weeks    Status New      PT LONG TERM GOAL #3   Title Pt will improve gait speed with no AD vs. LRAD to at least 2.6 ft/sec in order to demo improved community mobility.    Baseline 2.09 ft/sec with no AD    Time 8    Period Weeks    Status New      PT LONG TERM GOAL #4   Title Pt will improve FGA score to at least a 20/30 in order to demo decr fall risk.    Baseline 14/30 ON 09/05/20    Time 8    Period Weeks    Status New      PT LONG TERM GOAL #5   Title Pt will decr 5x sit <> stand to 22 seconds or less without BUE support in order to demo improved strength and balance.    Baseline 30.13 seconds without UE support from chair    Time 8     Period Weeks    Status New                   Plan - 10/04/20 1448     Clinical Impression Statement Pt presents today for 2nd visit since eval; she was unable to come to visits last week due to cyst in buttocks area causing pain/discomfort.  She returns to PT today and STGs assessed.  Pt has met 2 of 3 STGs.  STG 1 met for HEP; STG 2 met for 5x sit<>stand.  STG 3 partially met, with improvement made on FGA, just not to goal level.  She continues to be at fall  risk per FGA score and she will continue to benefit from skilled PT to address balance, functional strength, for improved gait and decreased fall risk.    Personal Factors and Comorbidities Past/Current Experience;Comorbidity 3+;Time since onset of injury/illness/exacerbation    Comorbidities PMH: HTN, peripheral neuropathy, type 2 diabetes    Examination-Activity Limitations Locomotion Level;Transfers;Squat;Stairs;Stand    Examination-Participation Restrictions Community Activity;Shop    Stability/Clinical Decision Making Stable/Uncomplicated    Rehab Potential Good    PT Frequency 1x / week   followed by 2x week for 4 weeks   PT Duration 3 weeks   followed by 2x week for 4 weeks   PT Treatment/Interventions ADLs/Self Care Home Management;DME Instruction;Gait training;Stair training;Functional mobility training;Therapeutic activities;Therapeutic exercise;Balance training;Patient/family education;Neuromuscular re-education    PT Next Visit Plan Add to HEP as able:  ankle/hip strategy work, short distance gait with head turns; continue to work on balance - (eyes closed, retro gait, SLS, gait with head motions, compliant surfaces). functional BLE strength.    PT Home Exercise Plan [email or web address]    Consulted and Agree with Plan of Care Patient             Patient will benefit from skilled therapeutic intervention in order to improve the following deficits and impairments:  Abnormal gait, Decreased balance, Decreased endurance,  Decreased activity tolerance, Difficulty walking, Decreased strength, Impaired sensation  Visit Diagnosis: Unsteadiness on feet  Muscle weakness (generalized)     Problem List Patient Active Problem List   Diagnosis Date Noted   Fatigue 01/12/2018   Pain due to dental caries 07/22/2015   Hypertension 06/29/2014   Shoulder pain, left 02/18/2014   Depression 02/18/2014   HLD (hyperlipidemia) 11/19/2013   Other malaise and fatigue 11/12/2013   Hidradenitis suppurativa 12/18/2012   Health care maintenance 07/08/2012   Abnormality of gait 07/08/2012   Poorly controlled type 2 diabetes mellitus with peripheral neuropathy ([place]) 07/03/2012   Morbid obesity ([place]) 07/03/2012   Tobacco abuse 07/03/2012    Yer Olivencia W. 10/04/2020, 2:51 PM Frazier Butt., PT   [facility] [address] [place], [place], [address] Phone: [phone]   Fax:  [phone]  Name: Melanie Henry MRN: [id number] Date of Birth: 1960/11/23

## 2020-10-06 ENCOUNTER — Other Ambulatory Visit: Payer: Self-pay

## 2020-10-06 ENCOUNTER — Ambulatory Visit: Payer: Medicaid Other | Admitting: Physical Therapy

## 2020-10-06 DIAGNOSIS — R2681 Unsteadiness on feet: Secondary | ICD-10-CM | POA: Diagnosis not present

## 2020-10-06 DIAGNOSIS — R2689 Other abnormalities of gait and mobility: Secondary | ICD-10-CM

## 2020-10-06 DIAGNOSIS — M6281 Muscle weakness (generalized): Secondary | ICD-10-CM

## 2020-10-06 NOTE — Therapy (Signed)
[facility] [address] [place], [place], [address] Phone: [phone]   Fax:  [phone]  Physical Therapy Treatment  Patient Details  Name: Melanie Henry MRN: [id number] Date of Birth: 07-Aug-1960 Referring Provider (PT): Lucianne Lei, MD   Encounter Date: 10/06/2020   PT End of Session - 10/06/20 1103     Visit Number 4    Number of Visits 12   1x week for 3 weeks followed by 2x week for 4 weeks   Date for PT Re-Evaluation 11/04/20    Authorization Type Medicaid    PT Start Time 1104    PT Stop Time 1146    PT Time Calculation (min) 42 min    Equipment Utilized During Treatment Gait belt    Activity Tolerance Patient tolerated treatment well    Behavior During Therapy [facility] for tasks assessed/performed             Past Medical History:  Diagnosis Date   Daily headache    "for the past 2-3 wks" (07/02/2012)   Depression    Dysrhythmia    Eclampsia 1988   stroke like symtoms with pregnancy, h/o protenuria with eclampsia    Heart murmur 1988   "I had eclampsia; they said I had a stroke" (07/02/2012)   Hidradenitis suppurativa    History of juvenile arthritis    diagnosed age 85 y.o; affects hands, knees    Hypertension    Hypothyroid    Kidney stones    "once" (07/02/2012)   Migraines    "occasionally" (07/02/2012)   Obesity    Peripheral neuropathy    As of 07/2012 patient reports she was dx'ed 25 yrs ago previously taking Neurontin and B12 shot    Pneumonia ~ 1991   "once" (07/02/2012)   Tachycardia     Past Surgical History:  Procedure Laterality Date   ANKLE FUSION Right 1980's   "pinned" (07/02/2012)   [place]; [place] Right 1970's   "pinned; reattached tendon" (07/02/2012)   TONSILLECTOMY  1980   TUBAL LIGATION  03/1989; 06/1990    There were no vitals filed for this visit.   Subjective Assessment - 10/06/20 1104     Subjective Nothing new since last visit.  No changes.   Been pretty good this week.    Pertinent History PMH: HTN, peripheral neuropathy, type 2 diabetes    Limitations Walking;Standing    Patient Stated Goals wants to build up her strength, find some safe exercises.    Currently in Pain? Yes    Pain Score 2     Pain Location Back    Pain Orientation Right;Left;Lower    Pain Descriptors / Indicators Aching    Pain Type Chronic pain    Pain Frequency Intermittent    Aggravating Factors  standing up a long time, sleeping wrong    Pain Relieving Factors rest, icy hot                               [facility] Adult PT Treatment/Exercise - 10/06/20 0001       Posture/Postural Control   Posture Comments Cues for abdominal activation with standing balance exercises.      Exercises   Exercises Other Exercises    Other Exercises  Seated trunk stability exercises at edge of mat:  anterior/post pelvic tilts x 10, then with neutral spine and abdominal activation:  alt UE  lifts x 5 reps, BUE lifts x 5 reps, then BUE initiated trunk rotation x 5 reps; seated march 2 sets x 5 reps, seated LAQ 2 sets x 5 reps.  Lateral pelvic tilts x 10, forward lean>upright posture x 10; seated opposite leg/arm lifts 2 sets x 5 reps                 Balance Exercises - 10/06/20 0001       Balance Exercises: Standing   Stepping Strategy Anterior;Posterior;Limitations    Stepping Strategy Limitations alternating stepping off blue air ex, x10 reps each direction, incr difficulty with forward stepping    Gait with Head Turns Forward;Intermittent upper extremity support;3 reps;Limitations    Gait with Head Turns Limitations Along counter, head turns x 4 reps, head nods x 4 reps    Sidestepping Limitations;4 reps    Sidestepping Limitations R and L along counter; added head turns for 2 reps    Marching Solid surface;Upper extremity assist 1;Dynamic;Forwards;Retro;Limitations    Marching Limitations 2 reps forward/back at counter, progress to tandem  march forward/back along counter 2 reps.    Other Standing Exercises Stagger foot position rocking 5 reps each position, lateral weightshifting x 5 reps, then wide BOS, anterior/posterior rocking at hips for low back/hip stretch, 3 reps.  Discussed pt can do these exercises as ways to keep low back from being stiff/sore with prolonged standing.    Other Standing Exercises Comments Standing on [employer]:  ankle/hip strategy work x 10 reps each with UE support.            Gait x 115 ft, 2 reps, then 200 ft with environmental scanning tasks.  Pt with good head motions looking for objects, just slowed gait at times.   PT Education - 10/06/20 1150     Education Details Continue with current HEP, standing weightshifting positions for lessening back soreness with standing    Person(s) Educated Patient    Methods Explanation    Comprehension Verbalized understanding              PT Short Term Goals - 10/04/20 1422       PT SHORT TERM GOAL #1   Title Pt will be independent in initial HEP in order to build upon functional gains made in therapy. ALL STGS DUE AFTER 3 VISITS OR BY 10/03/20    Baseline currently dependent.    Time 4   VISITS   Period Weeks    Status Achieved    Target Date 10/03/20      PT SHORT TERM GOAL #2   Title Pt will decr 5x sit <> stand to 26 seconds or less without BUE support in order to demo improved strength and balance.    Baseline 30.13 seconds without UE support from chair; 24.22 sec 10/04/20    Time 4    Period Weeks    Status Achieved      PT SHORT TERM GOAL #3   Title Pt will improve FGA score to at least a 17/30 in order to demo decr fall risk.    Baseline 14/30, 16/30 10/04/2020    Time 4    Period Weeks    Status Partially Met               PT Long Term Goals - 09/05/20 2120       PT LONG TERM GOAL #1   Title Pt will be independent in final HEP in order to build upon functional gains made  in therapy. ALL STGS DUE AFTER 12 VISITS OR BY 11/05/20     Baseline currently dependent.    Time 8    Period Weeks    Status New    Target Date 11/05/20      PT LONG TERM GOAL #2   Title Pt will ambulate at least 250' outdoors over paved/grass surfaces with LRAD vs. no AD in order  to demo improved community mobility.    Baseline not yet assessed.    Time 8    Period Weeks    Status New      PT LONG TERM GOAL #3   Title Pt will improve gait speed with no AD vs. LRAD to at least 2.6 ft/sec in order to demo improved community mobility.    Baseline 2.09 ft/sec with no AD    Time 8    Period Weeks    Status New      PT LONG TERM GOAL #4   Title Pt will improve FGA score to at least a 20/30 in order to demo decr fall risk.    Baseline 14/30 ON 09/05/20    Time 8    Period Weeks    Status New      PT LONG TERM GOAL #5   Title Pt will decr 5x sit <> stand to 22 seconds or less without BUE support in order to demo improved strength and balance.    Baseline 30.13 seconds without UE support from chair    Time 8    Period Weeks    Status New                   Plan - 10/06/20 1151     Clinical Impression Statement Focused skilled PT session today on core stability/trunk control (per pt's request, as back is sore today), dynamic balance and gait.  With pelvic tilts and with core stabilization in sitting, she notes improvement in back soreness.  She conitnues to need intermittent/light UE support for dynamic balance activities, especially on compliant surfaces.  She will contineu to benefit from skilled PT to work towards [place].    Personal Factors and Comorbidities Past/Current Experience;Comorbidity 3+;Time since onset of injury/illness/exacerbation    Comorbidities PMH: HTN, peripheral neuropathy, type 2 diabetes    Examination-Activity Limitations Locomotion Level;Transfers;Squat;Stairs;Stand    Examination-Participation Restrictions Community Activity;Shop    Stability/Clinical Decision Making Stable/Uncomplicated    Rehab  Potential Good    PT Frequency 1x / week   followed by 2x week for 4 weeks   PT Duration 3 weeks   followed by 2x week for 4 weeks   PT Treatment/Interventions ADLs/Self Care Home Management;DME Instruction;Gait training;Stair training;Functional mobility training;Therapeutic activities;Therapeutic exercise;Balance training;Patient/family education;Neuromuscular re-education    PT Next Visit Plan Add to HEP as able:  ankle/hip strategy work, short distance gait with head turns; continue to work on balance - (eyes closed, retro gait, SLS, gait with head motions, compliant surfaces). functional BLE strength. *Chloe-you may want to double check weeks in POC/scheduled appts and see if she can/wants to schedule more; I think she could schedule 1 more week in POC, but check behind me and with patient, please.    PT Home Exercise Plan [id number]    Consulted and Agree with Plan of Care Patient             Patient will benefit from skilled therapeutic intervention in order to improve the following deficits and impairments:  Abnormal gait, Decreased balance, Decreased endurance,  Decreased activity tolerance, Difficulty walking, Decreased strength, Impaired sensation  Visit Diagnosis: Unsteadiness on feet  Muscle weakness (generalized)  Other abnormalities of gait and mobility     Problem List Patient Active Problem List   Diagnosis Date Noted   Fatigue 01/12/2018   Pain due to dental caries 07/22/2015   Hypertension 06/29/2014   Shoulder pain, left 02/18/2014   Depression 02/18/2014   HLD (hyperlipidemia) 11/19/2013   Other malaise and fatigue 11/12/2013   Hidradenitis suppurativa 12/18/2012   Health care maintenance 07/08/2012   Abnormality of gait 07/08/2012   Poorly controlled type 2 diabetes mellitus with peripheral neuropathy ([place]) 07/03/2012   Morbid obesity ([place]) 07/03/2012   Tobacco abuse 07/03/2012    Melanie Henry W. 10/06/2020, 11:56 AM Frazier Butt., PT   [facility] [address] [place], [place], [address] Phone: [phone]   Fax:  [phone]  Name: Melanie Henry MRN: [id number] Date of Birth: 1960-08-21

## 2020-10-11 ENCOUNTER — Encounter: Payer: Self-pay | Admitting: Physical Therapy

## 2020-10-11 ENCOUNTER — Ambulatory Visit: Payer: Medicaid Other | Admitting: Physical Therapy

## 2020-10-11 ENCOUNTER — Other Ambulatory Visit: Payer: Self-pay

## 2020-10-11 DIAGNOSIS — M6281 Muscle weakness (generalized): Secondary | ICD-10-CM

## 2020-10-11 DIAGNOSIS — R2681 Unsteadiness on feet: Secondary | ICD-10-CM

## 2020-10-11 DIAGNOSIS — R2689 Other abnormalities of gait and mobility: Secondary | ICD-10-CM

## 2020-10-11 NOTE — Therapy (Addendum)
[facility] [address] [place], [place], [address] Phone: [phone]   Fax:  [phone]  Physical Therapy Treatment  Patient Details  Name: Melanie Henry MRN: [id number] Date of Birth: [birth year] Referring Provider (PT): Lucianne Lei, MD   Encounter Date: 10/11/2020   PT End of Session - 10/11/20 1313     Visit Number 5    Number of Visits 12   1x week for 3 weeks followed by 2x week for 4 weeks   Date for PT Re-Evaluation 11/04/20    Authorization Type Medicaid    PT Start Time 1232    PT Stop Time 1313    PT Time Calculation (min) 41 min    Equipment Utilized During Treatment Gait belt    Activity Tolerance Patient tolerated treatment well    Behavior During Therapy [facility] for tasks assessed/performed             Past Medical History:  Diagnosis Date   Daily headache    "for the past 2-3 wks" (07/02/2012)   Depression    Dysrhythmia    Eclampsia 1988   stroke like symtoms with pregnancy, h/o protenuria with eclampsia    Heart murmur 1988   "I had eclampsia; they said I had a stroke" (07/02/2012)   Hidradenitis suppurativa    History of juvenile arthritis    diagnosed age 44 y.o; affects hands, knees    Hypertension    Hypothyroid    Kidney stones    "once" (07/02/2012)   Migraines    "occasionally" (07/02/2012)   Obesity    Peripheral neuropathy    As of 07/2012 patient reports she was dx'ed 25 yrs ago previously taking Neurontin and B12 shot    Pneumonia ~ 1991   "once" (07/02/2012)   Tachycardia     Past Surgical History:  Procedure Laterality Date   ANKLE FUSION Right 1980's   "pinned" (07/02/2012)   [place]; [place] Right 1970's   "pinned; reattached tendon" (07/02/2012)   TONSILLECTOMY  1980   TUBAL LIGATION  03/1989; 06/1990    There were no vitals filed for this visit.   Subjective Assessment - 10/11/20 1235     Subjective Has been trying to do some of the back  stretches and reports that they have been helping.    Pertinent History PMH: HTN, peripheral neuropathy, type 2 diabetes    Limitations Walking;Standing    Patient Stated Goals wants to build up her strength, find some safe exercises.    Pain Score 3     Pain Location Knee    Pain Orientation Left    Pain Descriptors / Indicators Sharp   when it happens   Pain Type Chronic pain    Aggravating Factors  nothing    Pain Relieving Factors being still                               [facility] Adult PT Treatment/Exercise - 10/11/20 1241       Transfers   Transfers Sit to Stand;Stand to Sit    Sit to Stand 5: Supervision;Without upper extremity assist;From chair/3-in-1    Comments x5 reps on level ground, x10 reps on blue air ex without UE support                 Balance Exercises - 10/11/20 1248       Balance Exercises:  Standing   Standing Eyes Opened Foam/compliant surface;Limitations    Standing Eyes Opened Limitations on blue air ex, feet hip width distance, 2 x10 reps head turns,  2 x10 reps head nods    Tandem Stance Eyes open;Intermittent upper extremity support;2 reps;30 secs;Limitations    Tandem Stance Time 2 x 30 reps B    SLS Eyes open;Modified;Limitations    SLS Limitations modified at stair case, x5 reps head turns, x5 reps head nods - with each leg as stance leg, incr difficulty with LLE    Stepping Strategy Anterior;Foam/compliant surface    Stepping Strategy Limitations alternating stepping off blue air ex, x10 reps - intermittent UE support for balance    Sidestepping Foam/compliant support    Sidestepping Limitations side stepping down and back on blue foam beam x3 reps, cues for slowed and controlled.    Other Standing Exercises on blue air ex: alternating static marching with intermittent UE support x5 reps each side    Other Standing Exercises Comments on blue/red mats: forward and retro marching down and back x3 reps, tandem gait down and  back x2 reps with intermittent UE support for balance                 PT Short Term Goals - 10/04/20 1422       PT SHORT TERM GOAL #1   Title Pt will be independent in initial HEP in order to build upon functional gains made in therapy. ALL STGS DUE AFTER 3 VISITS OR BY 10/03/20    Baseline currently dependent.    Time 4   VISITS   Period Weeks    Status Achieved    Target Date 10/03/20      PT SHORT TERM GOAL #2   Title Pt will decr 5x sit <> stand to 26 seconds or less without BUE support in order to demo improved strength and balance.    Baseline 30.13 seconds without UE support from chair; 24.22 sec 10/04/20    Time 4    Period Weeks    Status Achieved      PT SHORT TERM GOAL #3   Title Pt will improve FGA score to at least a 17/30 in order to demo decr fall risk.    Baseline 14/30, 16/30 10/04/2020    Time 4    Period Weeks    Status Partially Met               PT Long Term Goals - 09/05/20 2120       PT LONG TERM GOAL #1   Title Pt will be independent in final HEP in order to build upon functional gains made in therapy. ALL STGS DUE AFTER 12 VISITS OR BY 11/05/20    Baseline currently dependent.    Time 8    Period Weeks    Status New    Target Date 11/05/20      PT LONG TERM GOAL #2   Title Pt will ambulate at least 250' outdoors over paved/grass surfaces with LRAD vs. no AD in order  to demo improved community mobility.    Baseline not yet assessed.    Time 8    Period Weeks    Status New      PT LONG TERM GOAL #3   Title Pt will improve gait speed with no AD vs. LRAD to at least 2.6 ft/sec in order to demo improved community mobility.    Baseline 2.09 ft/sec with no AD  Time 8    Period Weeks    Status New      PT LONG TERM GOAL #4   Title Pt will improve FGA score to at least a 20/30 in order to demo decr fall risk.    Baseline 14/30 ON 09/05/20    Time 8    Period Weeks    Status New      PT LONG TERM GOAL #5   Title Pt will decr 5x sit  <> stand to 22 seconds or less without BUE support in order to demo improved strength and balance.    Baseline 30.13 seconds without UE support from chair    Time 8    Period Weeks    Status New                   Plan - 10/11/20 1318     Clinical Impression Statement Today's skilled session focused on standing balance strategies on compliant surfaces, SLS activities and BLE strengthening. Pt tolerated session well, needing intermittent UE support at times for balance esp with head motions with balance. Pt reports improvement in L knee pain with activity. Will continue to progress towards LTGs.    Personal Factors and Comorbidities Past/Current Experience;Comorbidity 3+;Time since onset of injury/illness/exacerbation    Comorbidities PMH: HTN, peripheral neuropathy, type 2 diabetes    Examination-Activity Limitations Locomotion Level;Transfers;Squat;Stairs;Stand    Examination-Participation Restrictions Community Activity;Shop    Stability/Clinical Decision Making Stable/Uncomplicated    Rehab Potential Good    PT Frequency 1x / week   followed by 2x week for 4 weeks   PT Duration 3 weeks   followed by 2x week for 4 weeks   PT Treatment/Interventions ADLs/Self Care Home Management;DME Instruction;Gait training;Stair training;Functional mobility training;Therapeutic activities;Therapeutic exercise;Balance training;Patient/family education;Neuromuscular re-education    PT Next Visit Plan Add to HEP as able:  ankle/hip strategy work, short distance gait with head turns; continue to work on balance - (eyes closed, retro gait, SLS, gait with head motions, compliant surfaces). functional BLE strength.    PT Home Exercise Plan [id number]    Consulted and Agree with Plan of Care Patient             Patient will benefit from skilled therapeutic intervention in order to improve the following deficits and impairments:  Abnormal gait, Decreased balance, Decreased endurance, Decreased activity  tolerance, Difficulty walking, Decreased strength, Impaired sensation  Visit Diagnosis: Unsteadiness on feet  Muscle weakness (generalized)  Other abnormalities of gait and mobility     Problem List Patient Active Problem List   Diagnosis Date Noted   Fatigue 01/12/2018   Pain due to dental caries 07/22/2015   Hypertension 06/29/2014   Shoulder pain, left 02/18/2014   Depression 02/18/2014   HLD (hyperlipidemia) 11/19/2013   Other malaise and fatigue 11/12/2013   Hidradenitis suppurativa 12/18/2012   Health care maintenance 07/08/2012   Abnormality of gait 07/08/2012   Poorly controlled type 2 diabetes mellitus with peripheral neuropathy ([place]) 07/03/2012   Morbid obesity ([place]) 07/03/2012   Tobacco abuse 07/03/2012    Arliss Journey, PT, DPT  10/11/2020, 1:20 PM  [facility] [address] [place], [place], [address] Phone: [phone]   Fax:  [phone]  Name: Melanie Henry MRN: [id number] Date of Birth: 1960/10/11

## 2020-10-13 ENCOUNTER — Encounter: Payer: Self-pay | Admitting: Physical Therapy

## 2020-10-13 ENCOUNTER — Ambulatory Visit: Payer: Medicaid Other | Admitting: Physical Therapy

## 2020-10-13 ENCOUNTER — Other Ambulatory Visit: Payer: Self-pay

## 2020-10-13 VITALS — BP 145/80 | HR 90

## 2020-10-13 DIAGNOSIS — R2681 Unsteadiness on feet: Secondary | ICD-10-CM

## 2020-10-13 DIAGNOSIS — M6281 Muscle weakness (generalized): Secondary | ICD-10-CM

## 2020-10-13 DIAGNOSIS — R2689 Other abnormalities of gait and mobility: Secondary | ICD-10-CM

## 2020-10-13 NOTE — Therapy (Signed)
[facility] [address] [place], [place], [address] Phone: [phone]   Fax:  [phone]  Physical Therapy Treatment  Patient Details  Name: Melanie Henry MRN: [id number] Date of Birth: 09/05/1960 Referring Provider (PT): Lucianne Lei, MD   Encounter Date: 10/13/2020   PT End of Session - 10/13/20 1525     Visit Number 6    Number of Visits 12   1x week for 3 weeks followed by 2x week for 4 weeks   Date for PT Re-Evaluation 11/04/20    Authorization Type Medicaid    PT Start Time 1442    PT Stop Time 1525    PT Time Calculation (min) 43 min    Equipment Utilized During Treatment Gait belt    Activity Tolerance Patient tolerated treatment well    Behavior During Therapy [facility] for tasks assessed/performed             Past Medical History:  Diagnosis Date   Daily headache    "for the past 2-3 wks" (07/02/2012)   Depression    Dysrhythmia    Eclampsia 60   stroke like symtoms with pregnancy, h/o protenuria with eclampsia    Heart murmur 1988   "I had eclampsia; they said I had a stroke" (07/02/2012)   Hidradenitis suppurativa    History of juvenile arthritis    diagnosed age 60 y.o; affects hands, knees    Hypertension    Hypothyroid    Kidney stones    "once" (07/02/2012)   Migraines    "occasionally" (07/02/2012)   Obesity    Peripheral neuropathy    As of 07/2012 patient reports she was dx'ed 25 yrs ago previously taking Neurontin and B12 shot    Pneumonia ~ 1991   "once" (07/02/2012)   Tachycardia     Past Surgical History:  Procedure Laterality Date   ANKLE FUSION Right 1980's   "pinned" (07/02/2012)   [place]; [place] Right 1970's   "pinned; reattached tendon" (07/02/2012)   TONSILLECTOMY  1980   TUBAL LIGATION  03/1989; 06/1990    Vitals:   10/13/20 1446  BP: (!) 145/80  Pulse: 90     Subjective Assessment - 10/13/20 1444     Subjective Thinks that the sit <> stands  may have irritated the cyst on her back.    Pertinent History PMH: HTN, peripheral neuropathy, type 2 diabetes    Limitations Walking;Standing    Patient Stated Goals wants to build up her strength, find some safe exercises.    Currently in Pain? No/denies                               [facility] Adult PT Treatment/Exercise - 10/13/20 1450       Ambulation/Gait   Ambulation/Gait Yes    Ambulation/Gait Assistance 5: Supervision    Gait Comments gait down hallway: working on scanning environment with head motions (cues to slow pace at times as it gives pt more dificulty), retro gait with quick start stops and walking forwards then backwards, 4 x 50'      Exercises   Exercises Other Exercises    Other Exercises  NuStep with BLE/BUE at gear 2 for 5 minutes for strengthening/activity tolerance, spm between 65-70                 Balance Exercises - 10/13/20 1503  Balance Exercises: Standing   Standing Eyes Closed Foam/compliant surface;Limitations    Standing Eyes Closed Limitations on blue air ex: narrow BOS 3 x 30 seconds, wide BOS head turns 2 x 10 reps, head nods 2 x 10 reps - intermittent taps to chair for balance    SLS Eyes open;Foam/compliant surface;Solid surface    SLS Limitations alternating toe taps to 2 cones x8 reps B, then progressed to forward/cross body tap x10 reps, and then added in pt standing on 1" foam with cross body cone taps x8 reps B with initial UE support then none, cues for slowed and controlled.    Step Ups Forward;UE support 1;Limitations;6 inch    Step Ups Limitations at bottom of staircase on blue air ex: x10 reps step up/up and then down/down alternating legs each time without UE support, min guard when stepping back down at times for balance. on level ground x5 reps each leg stepping one leg up and floating non-stance leg for strengthening/dynamic SLS - needing single UE support, incr difficulty with RLE    Step Over Hurdles /  Cones stepping over 4 obstacles (smaller orange ones) next to countertop in a reciprocal pattern down and back x4 reps, beginning with UE support and then needing fingertips on countertop.    Other Standing Exercises on blue air ex: x10 reps heel <> toe raises with no UE support                 PT Short Term Goals - 10/04/20 1422       PT SHORT TERM GOAL #1   Title Pt will be independent in initial HEP in order to build upon functional gains made in therapy. ALL STGS DUE AFTER 3 VISITS OR BY 10/03/20    Baseline currently dependent.    Time 4   VISITS   Period Weeks    Status Achieved    Target Date 10/03/20      PT SHORT TERM GOAL #2   Title Pt will decr 5x sit <> stand to 26 seconds or less without BUE support in order to demo improved strength and balance.    Baseline 30.13 seconds without UE support from chair; 24.22 sec 10/04/20    Time 4    Period Weeks    Status Achieved      PT SHORT TERM GOAL #3   Title Pt will improve FGA score to at least a 17/30 in order to demo decr fall risk.    Baseline 14/30, 16/30 10/04/2020    Time 4    Period Weeks    Status Partially Met               PT Long Term Goals - 09/05/20 2120       PT LONG TERM GOAL #1   Title Pt will be independent in final HEP in order to build upon functional gains made in therapy. ALL STGS DUE AFTER 12 VISITS OR BY 11/05/20    Baseline currently dependent.    Time 8    Period Weeks    Status New    Target Date 11/05/20      PT LONG TERM GOAL #2   Title Pt will ambulate at least 250' outdoors over paved/grass surfaces with LRAD vs. no AD in order  to demo improved community mobility.    Baseline not yet assessed.    Time 8    Period Weeks    Status New      PT  LONG TERM GOAL #3   Title Pt will improve gait speed with no AD vs. LRAD to at least 2.6 ft/sec in order to demo improved community mobility.    Baseline 2.09 ft/sec with no AD    Time 8    Period Weeks    Status New      PT LONG TERM  GOAL #4   Title Pt will improve FGA score to at least a 20/30 in order to demo decr fall risk.    Baseline 14/30 ON 09/05/20    Time 8    Period Weeks    Status New      PT LONG TERM GOAL #5   Title Pt will decr 5x sit <> stand to 22 seconds or less without BUE support in order to demo improved strength and balance.    Baseline 30.13 seconds without UE support from chair    Time 8    Period Weeks    Status New                   Plan - 10/13/20 1528     Clinical Impression Statement Today's skilled session focused on dynamic gait training, balance with decr visual input, SLS activities and BLE strengthening. Pt tolerated session well, needing intermittent UE support when vision is removed on foam. Incr difficulty when leading with RLE during step ups. Will continue to progress towards LTGs.    Personal Factors and Comorbidities Past/Current Experience;Comorbidity 3+;Time since onset of injury/illness/exacerbation    Comorbidities PMH: HTN, peripheral neuropathy, type 2 diabetes    Examination-Activity Limitations Locomotion Level;Transfers;Squat;Stairs;Stand    Examination-Participation Restrictions Community Activity;Shop    Stability/Clinical Decision Making Stable/Uncomplicated    Rehab Potential Good    PT Frequency 1x / week   followed by 2x week for 4 weeks   PT Duration 3 weeks   followed by 2x week for 4 weeks   PT Treatment/Interventions ADLs/Self Care Home Management;DME Instruction;Gait training;Stair training;Functional mobility training;Therapeutic activities;Therapeutic exercise;Balance training;Patient/family education;Neuromuscular re-education    PT Next Visit Plan add to HEP and make more challenging. ankle/hip strategy work, gait with head turns; continue to work on balance - (eyes closed, retro gait, SLS, gait with head motions, compliant surfaces). step ups.    PT Home Exercise Plan [email or web address]    Consulted and Agree with Plan of Care Patient              Patient will benefit from skilled therapeutic intervention in order to improve the following deficits and impairments:  Abnormal gait, Decreased balance, Decreased endurance, Decreased activity tolerance, Difficulty walking, Decreased strength, Impaired sensation  Visit Diagnosis: Muscle weakness (generalized)  Unsteadiness on feet  Other abnormalities of gait and mobility     Problem List Patient Active Problem List   Diagnosis Date Noted   Fatigue 01/12/2018   Pain due to dental caries 07/22/2015   Hypertension 06/29/2014   Shoulder pain, left 02/18/2014   Depression 02/18/2014   HLD (hyperlipidemia) 11/19/2013   Other malaise and fatigue 11/12/2013   Hidradenitis suppurativa 12/18/2012   Health care maintenance 07/08/2012   Abnormality of gait 07/08/2012   Poorly controlled type 2 diabetes mellitus with peripheral neuropathy ([place]) 07/03/2012   Morbid obesity ([place]) 07/03/2012   Tobacco abuse 07/03/2012    Arliss Journey, PT, DPT  10/13/2020, 3:29 PM  [facility] [address] [place], [place], [address] Phone: [phone]   Fax:  [phone]  Name: Melanie Henry MRN:  [id number] Date of Birth: 07/18/1960

## 2020-10-18 ENCOUNTER — Encounter: Payer: Self-pay | Admitting: Physical Therapy

## 2020-10-18 ENCOUNTER — Ambulatory Visit: Payer: Medicaid Other | Admitting: Physical Therapy

## 2020-10-18 ENCOUNTER — Other Ambulatory Visit: Payer: Self-pay

## 2020-10-18 DIAGNOSIS — R2681 Unsteadiness on feet: Secondary | ICD-10-CM

## 2020-10-18 DIAGNOSIS — M6281 Muscle weakness (generalized): Secondary | ICD-10-CM

## 2020-10-18 DIAGNOSIS — R2689 Other abnormalities of gait and mobility: Secondary | ICD-10-CM

## 2020-10-18 NOTE — Therapy (Signed)
[facility] [address] [place], [place], [address] Phone: [phone]   Fax:  [phone]  Physical Therapy Treatment  Patient Details  Name: Melanie Henry MRN: [id number] Date of Birth: 05-30-1960 Referring Provider (PT): Lucianne Lei, MD   Encounter Date: 10/18/2020   PT End of Session - 10/18/20 1412     Visit Number 7    Number of Visits 12   1x week for 3 weeks followed by 2x week for 4 weeks   Date for PT Re-Evaluation 11/04/20    Authorization Type Medicaid    PT Start Time 1233    PT Stop Time 1314    PT Time Calculation (min) 41 min    Equipment Utilized During Treatment Gait belt    Activity Tolerance Patient tolerated treatment well    Behavior During Therapy [facility] for tasks assessed/performed             Past Medical History:  Diagnosis Date   Daily headache    "for the past 2-3 wks" (07/02/2012)   Depression    Dysrhythmia    Eclampsia 1988   stroke like symtoms with pregnancy, h/o protenuria with eclampsia    Heart murmur 1988   "I had eclampsia; they said I had a stroke" (07/02/2012)   Hidradenitis suppurativa    History of juvenile arthritis    diagnosed age 36 y.o; affects hands, knees    Hypertension    Hypothyroid    Kidney stones    "once" (07/02/2012)   Migraines    "occasionally" (07/02/2012)   Obesity    Peripheral neuropathy    As of 07/2012 patient reports she was dx'ed 25 yrs ago previously taking Neurontin and B12 shot    Pneumonia ~ 1991   "once" (07/02/2012)   Tachycardia     Past Surgical History:  Procedure Laterality Date   ANKLE FUSION Right 1980's   "pinned" (07/02/2012)   [place]; [place] Right 1970's   "pinned; reattached tendon" (07/02/2012)   TONSILLECTOMY  1980   TUBAL LIGATION  03/1989; 06/1990    There were no vitals filed for this visit.   Subjective Assessment - 10/18/20 1235     Subjective Some days for balance are better than  others. Uses her SPC on days where she feels weaker.    Pertinent History PMH: HTN, peripheral neuropathy, type 2 diabetes    Limitations Walking;Standing    Patient Stated Goals wants to build up her strength, find some safe exercises.    Currently in Pain? No/denies                               [facility] Adult PT Treatment/Exercise - 10/18/20 1238       Exercises   Exercises Other Exercises    Other Exercises  NuStep with BLE/BUE at gear 4 for 6 minutes for strengthening/activity tolerance, spm between 80-85              Access Code: [email or web address] URL: [email or web address] Date: 10/18/2020 Prepared by: Janann August  Upgraded and revised HEP: Removed sit <> stands due to pt reporting incr pressure on cyst and was uncomfortable.   Exercises Standing Marching - 2 x daily - 5 x weekly - 3 sets - forwards and backwards at countertop  Standing Balance with Eyes Closed on Foam - 2 x daily - 5 x weekly - 3 sets - 30  hold - progressed to more narrow BOS  Mini Squat with Chair - 2 x daily - 5 x weekly - 1-2 sets - 10 reps - verbal and demo cues for proper technique  Tandem Walking with Counter Support - 1 x daily - 5 x weekly - 3 sets Wide Stance with Eyes Closed on Foam Pad - 1 x daily - 5 x weekly - 2 sets - 5 reps - eyes closed 2 x 5 reps head turns, 2 x 5 reps head nods with fingertips for balance     Balance Exercises - 10/18/20 0001       Balance Exercises: Standing   Step Ups Forward;UE support 1;Limitations;6 inch    Step Ups Limitations step up/up and step down/down alternating legs, pt reporting incr pain with LLE when descending and with terminal knee extension once standing on step, x8 reps B               PT Education - 10/18/20 1318     Education Details upgraded pt's HEP for balance and strength.    Person(s) Educated Patient    Methods Explanation;Demonstration;Handout    Comprehension Verbalized understanding;Returned  demonstration              PT Short Term Goals - 10/04/20 1422       PT SHORT TERM GOAL #1   Title Pt will be independent in initial HEP in order to build upon functional gains made in therapy. ALL STGS DUE AFTER 3 VISITS OR BY 10/03/20    Baseline currently dependent.    Time 4   VISITS   Period Weeks    Status Achieved    Target Date 10/03/20      PT SHORT TERM GOAL #2   Title Pt will decr 5x sit <> stand to 26 seconds or less without BUE support in order to demo improved strength and balance.    Baseline 30.13 seconds without UE support from chair; 24.22 sec 10/04/20    Time 4    Period Weeks    Status Achieved      PT SHORT TERM GOAL #3   Title Pt will improve FGA score to at least a 17/30 in order to demo decr fall risk.    Baseline 14/30, 16/30 10/04/2020    Time 4    Period Weeks    Status Partially Met               PT Long Term Goals - 09/05/20 2120       PT LONG TERM GOAL #1   Title Pt will be independent in final HEP in order to build upon functional gains made in therapy. ALL STGS DUE AFTER 12 VISITS OR BY 11/05/20    Baseline currently dependent.    Time 8    Period Weeks    Status New    Target Date 11/05/20      PT LONG TERM GOAL #2   Title Pt will ambulate at least 250' outdoors over paved/grass surfaces with LRAD vs. no AD in order  to demo improved community mobility.    Baseline not yet assessed.    Time 8    Period Weeks    Status New      PT LONG TERM GOAL #3   Title Pt will improve gait speed with no AD vs. LRAD to at least 2.6 ft/sec in order to demo improved community mobility.    Baseline 2.09 ft/sec with no AD  Time 8    Period Weeks    Status New      PT LONG TERM GOAL #4   Title Pt will improve FGA score to at least a 20/30 in order to demo decr fall risk.    Baseline 14/30 ON 09/05/20    Time 8    Period Weeks    Status New      PT LONG TERM GOAL #5   Title Pt will decr 5x sit <> stand to 22 seconds or less without BUE  support in order to demo improved strength and balance.    Baseline 30.13 seconds without UE support from chair    Time 8    Period Weeks    Status New                   Plan - 10/18/20 1419     Clinical Impression Statement Upgraded and revised pt's HEP today for strengthening and balance, pt tolerating well. Able to progress to more narrow BOS with eyes closed. When performing step ups to 6" step with UE support pt reporting incr pain in L knee. Will continue to progress towards LTGs.    Personal Factors and Comorbidities Past/Current Experience;Comorbidity 3+;Time since onset of injury/illness/exacerbation    Comorbidities PMH: HTN, peripheral neuropathy, type 2 diabetes    Examination-Activity Limitations Locomotion Level;Transfers;Squat;Stairs;Stand    Examination-Participation Restrictions Community Activity;Shop    Stability/Clinical Decision Making Stable/Uncomplicated    Rehab Potential Good    PT Frequency 1x / week   followed by 2x week for 4 weeks   PT Duration 3 weeks   followed by 2x week for 4 weeks   PT Treatment/Interventions ADLs/Self Care Home Management;DME Instruction;Gait training;Stair training;Functional mobility training;Therapeutic activities;Therapeutic exercise;Balance training;Patient/family education;Neuromuscular re-education    PT Next Visit Plan NuStep/Scifit for warmup/strengthening. functional BLE strength. continue to work on balance - (eyes closed, retro gait, SLS, gait with head motions, compliant surfaces).  hey amy - i checked the weeks and she was scheduled out for all the 2x week for 4 weeks, she missed one of the 1x week - unless we wanna tack one of those on the end?    PT Home Exercise Plan [email or web address]    Consulted and Agree with Plan of Care Patient             Patient will benefit from skilled therapeutic intervention in order to improve the following deficits and impairments:  Abnormal gait, Decreased balance, Decreased endurance,  Decreased activity tolerance, Difficulty walking, Decreased strength, Impaired sensation  Visit Diagnosis: Muscle weakness (generalized)  Other abnormalities of gait and mobility  Unsteadiness on feet     Problem List Patient Active Problem List   Diagnosis Date Noted   Fatigue 01/12/2018   Pain due to dental caries 07/22/2015   Hypertension 06/29/2014   Shoulder pain, left 02/18/2014   Depression 02/18/2014   HLD (hyperlipidemia) 11/19/2013   Other malaise and fatigue 11/12/2013   Hidradenitis suppurativa 12/18/2012   Health care maintenance 07/08/2012   Abnormality of gait 07/08/2012   Poorly controlled type 2 diabetes mellitus with peripheral neuropathy ([place]) 07/03/2012   Morbid obesity ([place]) 07/03/2012   Tobacco abuse 07/03/2012    Arliss Journey, PT, DPT  10/18/2020, 2:21 PM  [facility] [address] [place], [place], [address] Phone: [phone]   Fax:  [phone]  Name: Melanie Henry MRN: [id number] Date of Birth: 1960-09-03

## 2020-10-20 ENCOUNTER — Other Ambulatory Visit: Payer: Self-pay

## 2020-10-20 ENCOUNTER — Ambulatory Visit: Payer: Medicaid Other | Admitting: Physical Therapy

## 2020-10-20 DIAGNOSIS — R2689 Other abnormalities of gait and mobility: Secondary | ICD-10-CM

## 2020-10-20 DIAGNOSIS — M6281 Muscle weakness (generalized): Secondary | ICD-10-CM

## 2020-10-20 DIAGNOSIS — R2681 Unsteadiness on feet: Secondary | ICD-10-CM | POA: Diagnosis not present

## 2020-10-20 NOTE — Therapy (Signed)
[facility] [address] [place], [place], [address] Phone: [phone]   Fax:  [phone]  Physical Therapy Treatment  Patient Details  Name: Melanie Henry MRN: [id number] Date of Birth: 05/24/1960 Referring Provider (PT): Lucianne Lei, MD   Encounter Date: 10/20/2020   PT End of Session - 10/20/20 1319     Visit Number 8    Number of Visits 12   1x week for 3 weeks followed by 2x week for 4 weeks   Date for PT Re-Evaluation 11/04/20    Authorization Type Medicaid    PT Start Time 1319    PT Stop Time 7782    PT Time Calculation (min) 40 min    Equipment Utilized During Treatment Gait belt    Activity Tolerance Patient tolerated treatment well    Behavior During Therapy [facility] for tasks assessed/performed             Past Medical History:  Diagnosis Date   Daily headache    "for the past 2-3 wks" (07/02/2012)   Depression    Dysrhythmia    Eclampsia 1988   stroke like symtoms with pregnancy, h/o protenuria with eclampsia    Heart murmur 1988   "I had eclampsia; they said I had a stroke" (07/02/2012)   Hidradenitis suppurativa    History of juvenile arthritis    diagnosed age 43 y.o; affects hands, knees    Hypertension    Hypothyroid    Kidney stones    "once" (07/02/2012)   Migraines    "occasionally" (07/02/2012)   Obesity    Peripheral neuropathy    As of 07/2012 patient reports she was dx'ed 25 yrs ago previously taking Neurontin and B12 shot    Pneumonia ~ 1991   "once" (07/02/2012)   Tachycardia     Past Surgical History:  Procedure Laterality Date   ANKLE FUSION Right 1980's   "pinned" (07/02/2012)   [place]; [place] Right 1970's   "pinned; reattached tendon" (07/02/2012)   TONSILLECTOMY  1980   TUBAL LIGATION  03/1989; 06/1990    There were no vitals filed for this visit.   Subjective Assessment - 10/20/20 1321     Subjective Soreness in my knees, but not sure if it's  because of the new exercises.    Pertinent History PMH: HTN, peripheral neuropathy, type 2 diabetes    Limitations Walking;Standing    Patient Stated Goals wants to build up her strength, find some safe exercises.    Currently in Pain? Yes    Pain Location Knee    Pain Orientation Left;Right    Pain Descriptors / Indicators Aching    Pain Frequency Intermittent    Aggravating Factors  walking    Pain Relieving Factors sitting still                               [facility] Adult PT Treatment/Exercise - 10/20/20 0001       Transfers   Transfers Floor to Transfer   Floor to stand   Floor to Transfer 4: Min guard;5: Supervision;With upper extremity assist    Floor to Transfer Details (indicate cue type and reason) Per patient request, she wants to make sure she can get up from the floor.  Practiced x 2, standing facing mat table and coming to tall kneel>quadruped>side sit on mat.  PT provides cues for technique for floor>stand and  pt performs with supervision:  sidesit to quadruped>tall kneel and UE support at mat, to 1/2 kneel to stand.      Ambulation/Gait   Ambulation/Gait Yes    Ambulation/Gait Assistance 5: Supervision    Ambulation/Gait Assistance Details Gait with head turns, head nods, speed up/slow down, then quick turns/change of directions.  No LOB noted    Ambulation Distance (Feet) 460 Feet    Assistive device None    Gait Pattern Step-through pattern;Decreased stance time - right;Decreased hip/knee flexion - right;Decreased hip/knee flexion - left    Ambulation Surface Level;Indoor                 Balance Exercises - 10/20/20 0001       Balance Exercises: Standing   Wall Bumps Hip;10 reps;Limitations    Wall Bumps Limitations At counter, standing on red mat    Partial Tandem Stance Eyes open    Partial Tandem Stance Limitations Head turns/nods x 5 reps with EO, then EC head steady x 15 seconds; each foot position.    Sidestepping Foam/compliant  support    Sidestepping Limitations side stepping down and back on blue foam beam x3 reps    Marching Foam/compliant surface;Upper extremity assist 1;Dynamic;Forwards;Retro;Limitations    Marching Limitations 3 reps on red mat    Heel Raises Both;10 reps   compliant surface   Toe Raise Both;10 reps   compliant surface   Other Standing Exercises Comments On red mat:  wide BOS lateral weigthshifting x 10 reps, then shift and lift leg x 10 reps             Access Code: [email or web address] URL: [email or web address] Date: 10/18/2020 Prepared by: Janann August   Reviewed updates to HEP last visit, with pt return demo understanding.    Standing Marching - 2 x daily - 5 x weekly - 3 sets - forwards and backwards at countertop Standing Balance with Eyes Closed on Foam - 2 x daily - 5 x weekly - 3 sets - 30 hold - progressed to more narrow BOS Mini Squat with Chair - 2 x daily - 5 x weekly - 1-2 sets - 10 reps - verbal and demo cues for proper technique Tandem Walking with Counter Support - 1 x daily - 5 x weekly - 3 sets Wide Stance with Eyes Closed on Foam Pad - 1 x daily - 5 x weekly - 2 sets - 5 reps - eyes closed 2 x 5 reps head turns, 2 x 5 reps head nods with fingertips for balance     PT Short Term Goals - 10/04/20 1422       PT SHORT TERM GOAL #1   Title Pt will be independent in initial HEP in order to build upon functional gains made in therapy. ALL STGS DUE AFTER 3 VISITS OR BY 10/03/20    Baseline currently dependent.    Time 4   VISITS   Period Weeks    Status Achieved    Target Date 10/03/20      PT SHORT TERM GOAL #2   Title Pt will decr 5x sit <> stand to 26 seconds or less without BUE support in order to demo improved strength and balance.    Baseline 30.13 seconds without UE support from chair; 24.22 sec 10/04/20    Time 4    Period Weeks    Status Achieved      PT SHORT TERM GOAL #3   Title Pt will improve FGA score to at  least a 17/30 in order to demo  decr fall risk.    Baseline 14/30, 16/30 10/04/2020    Time 4    Period Weeks    Status Partially Met               PT Long Term Goals - 09/05/20 2120       PT LONG TERM GOAL #1   Title Pt will be independent in final HEP in order to build upon functional gains made in therapy. ALL STGS DUE AFTER 12 VISITS OR BY 11/05/20    Baseline currently dependent.    Time 8    Period Weeks    Status New    Target Date 11/05/20      PT LONG TERM GOAL #2   Title Pt will ambulate at least 250' outdoors over paved/grass surfaces with LRAD vs. no AD in order  to demo improved community mobility.    Baseline not yet assessed.    Time 8    Period Weeks    Status New      PT LONG TERM GOAL #3   Title Pt will improve gait speed with no AD vs. LRAD to at least 2.6 ft/sec in order to demo improved community mobility.    Baseline 2.09 ft/sec with no AD    Time 8    Period Weeks    Status New      PT LONG TERM GOAL #4   Title Pt will improve FGA score to at least a 20/30 in order to demo decr fall risk.    Baseline 14/30 ON 09/05/20    Time 8    Period Weeks    Status New      PT LONG TERM GOAL #5   Title Pt will decr 5x sit <> stand to 22 seconds or less without BUE support in order to demo improved strength and balance.    Baseline 30.13 seconds without UE support from chair    Time 8    Period Weeks    Status New                   Plan - 10/20/20 1512     Clinical Impression Statement Reviewed updates to HEP with pt return demo understanding of HEP.  She has 1-2 mild LOB standing on red mat iwth transitions between exercises.  Worked on education of floor to stand transfer as well as safe squatting technique. Pt continues to be motivated for therapy and she will continue to benefit from skilled PT towards LTGs for overall improved functional mobility.    Personal Factors and Comorbidities Past/Current Experience;Comorbidity 3+;Time since onset of injury/illness/exacerbation     Comorbidities PMH: HTN, peripheral neuropathy, type 2 diabetes    Examination-Activity Limitations Locomotion Level;Transfers;Squat;Stairs;Stand    Examination-Participation Restrictions Community Activity;Shop    Stability/Clinical Decision Making Stable/Uncomplicated    Rehab Potential Good    PT Frequency 1x / week   followed by 2x week for 4 weeks   PT Duration 3 weeks   followed by 2x week for 4 weeks   PT Treatment/Interventions ADLs/Self Care Home Management;DME Instruction;Gait training;Stair training;Functional mobility training;Therapeutic activities;Therapeutic exercise;Balance training;Patient/family education;Neuromuscular re-education    PT Next Visit Plan NuStep/Scifit for warmup/strengthening. functional BLE strength. continue to work on balance - (eyes closed, retro gait, SLS, gait with head motions, compliant surfaces).  (Pt asked about additional 1 visit/wk of therapy and pt unsure-she will let us know next week).  If next week  is last week, please check goals and plan for d/c.    PT Home Exercise Plan [email or web address]    Consulted and Agree with Plan of Care Patient             Patient will benefit from skilled therapeutic intervention in order to improve the following deficits and impairments:  Abnormal gait, Decreased balance, Decreased endurance, Decreased activity tolerance, Difficulty walking, Decreased strength, Impaired sensation  Visit Diagnosis: Unsteadiness on feet  Muscle weakness (generalized)  Other abnormalities of gait and mobility     Problem List Patient Active Problem List   Diagnosis Date Noted   Fatigue 01/12/2018   Pain due to dental caries 07/22/2015   Hypertension 06/29/2014   Shoulder pain, left 02/18/2014   Depression 02/18/2014   HLD (hyperlipidemia) 11/19/2013   Other malaise and fatigue 11/12/2013   Hidradenitis suppurativa 12/18/2012   Health care maintenance 07/08/2012   Abnormality of gait 07/08/2012   Poorly controlled type 2  diabetes mellitus with peripheral neuropathy ([place]) 07/03/2012   Morbid obesity ([place]) 07/03/2012   Tobacco abuse 07/03/2012    Xavior Niazi W. 10/20/2020, 3:18 PM Frazier Butt., PT  [facility] [address] [place], [place], [address] Phone: [phone]   Fax:  [phone]  Name: Melanie Henry MRN: [id number] Date of Birth: 03-05-1961

## 2020-10-25 ENCOUNTER — Ambulatory Visit: Payer: Medicaid Other | Admitting: Physical Therapy

## 2020-10-25 ENCOUNTER — Other Ambulatory Visit: Payer: Self-pay

## 2020-10-25 DIAGNOSIS — R2681 Unsteadiness on feet: Secondary | ICD-10-CM | POA: Diagnosis not present

## 2020-10-25 DIAGNOSIS — R2689 Other abnormalities of gait and mobility: Secondary | ICD-10-CM

## 2020-10-25 DIAGNOSIS — M6281 Muscle weakness (generalized): Secondary | ICD-10-CM

## 2020-10-25 NOTE — Therapy (Addendum)
[facility] [address] [place], [place], [address] Phone: [phone]   Fax:  [phone]  Physical Therapy Treatment  Patient Details  Name: Melanie Henry MRN: [id number] Date of Birth: May 05, 1960 Referring Provider (PT): Lucianne Lei, MD   Encounter Date: 10/25/2020   PT End of Session - 10/25/20 1315     Visit Number 9    Number of Visits 12   1x week for 3 weeks followed by 2x week for 4 weeks   Date for PT Re-Evaluation 11/04/20    Authorization Type [employer] Medicaid - auth needed for visits 13 and beyond    PT Start Time 1233    PT Stop Time 1314    PT Time Calculation (min) 41 min    Equipment Utilized During Treatment Gait belt    Activity Tolerance Patient tolerated treatment well    Behavior During Therapy [facility] for tasks assessed/performed             Past Medical History:  Diagnosis Date   Daily headache    "for the past 2-3 wks" (07/02/2012)   Depression    Dysrhythmia    Eclampsia 1988   stroke like symtoms with pregnancy, h/o protenuria with eclampsia    Heart murmur 1988   "I had eclampsia; they said I had a stroke" (07/02/2012)   Hidradenitis suppurativa    History of juvenile arthritis    diagnosed age 65 y.o; affects hands, knees    Hypertension    Hypothyroid    Kidney stones    "once" (07/02/2012)   Migraines    "occasionally" (07/02/2012)   Obesity    Peripheral neuropathy    As of 07/2012 patient reports she was dx'ed 25 yrs ago previously taking Neurontin and B12 shot    Pneumonia ~ 1991   "once" (07/02/2012)   Tachycardia     Past Surgical History:  Procedure Laterality Date   ANKLE FUSION Right 1980's   "pinned" (07/02/2012)   [place]; [place] Right 1970's   "pinned; reattached tendon" (07/02/2012)   TONSILLECTOMY  1980   TUBAL LIGATION  03/1989; 06/1990    There were no vitals filed for this visit.   Subjective Assessment - 10/25/20 1412      Subjective Nothing new    Pertinent History PMH: HTN, peripheral neuropathy, type 2 diabetes    Limitations Walking;Standing    Patient Stated Goals wants to build up her strength, find some safe exercises.    Currently in Pain? No/denies                               [facility] Adult PT Treatment/Exercise - 10/25/20 1240       Ambulation/Gait   Ambulation/Gait Yes    Ambulation/Gait Assistance 5: Supervision    Ambulation/Gait Assistance Details clinic distances throughout    Assistive device None    Gait Pattern Step-through pattern;Decreased stance time - right;Decreased hip/knee flexion - right;Decreased hip/knee flexion - left    Ambulation Surface Level;Indoor    Gait velocity 11.31 seconds = 2.9 ft/sec      Therapeutic Activites    Therapeutic Activities Other Therapeutic Activities    Other Therapeutic Activities discussed proper squat technique (as pt initially demonstrates by performing with incr lumbar flexion) - practiced x10 reps from picking up a cone from 4" step with proper technique and then from floor by using squat or  staggered stance lunge and UE support for balance. discussed for safety at home also using a reacher to help pick up objects from the floor for incr safety. pt reports she used to have one but she lost it. showed pt where to purchase new one from [place] - 10/25/20 1241       Balance Exercises: Standing   Rockerboard Anterior/posterior;Limitations    Rockerboard Limitations in A/P direction:eyes open-  x10 reps weight shifting, x10 reps head turns, x10 reps head nods, alternating mini marching x10 reps each side for SLS, 2 x 30 seconds eyes closed, eyes closed x10 reps head turns, x10 reps head nods    Tandem Gait Forward;3 reps;Intermittent upper extremity support;Limitations    Tandem Gait Limitations down and back 3 reps on blue foam beam    Sidestepping Foam/compliant support    Sidestepping  Limitations on blue balance beam down and back x1 rep with focus on SLS time, performed an additional 2 reps down and back with dual tasking/coordination by performing ball toss with PT tech    Marching Foam/compliant surface    Marching Limitations forwards/backwards marching on blue mat with reciprocal UE lift    Other Standing Exercises Comments x10 reps step up/up and down/down with blue air ex on 4" step, using UE support for balance               PT Education - 10/25/20 1315     Education Details getting scheduled for 1 additional visit in POC. see TA    Person(s) Educated Patient    Methods Explanation;Demonstration    Comprehension Verbalized understanding;Returned demonstration              PT Short Term Goals - 10/04/20 1422       PT SHORT TERM GOAL #1   Title Pt will be independent in initial HEP in order to build upon functional gains made in therapy. ALL STGS DUE AFTER 3 VISITS OR BY 10/03/20    Baseline currently dependent.    Time 4   VISITS   Period Weeks    Status Achieved    Target Date 10/03/20      PT SHORT TERM GOAL #2   Title Pt will decr 5x sit <> stand to 26 seconds or less without BUE support in order to demo improved strength and balance.    Baseline 30.13 seconds without UE support from chair; 24.22 sec 10/04/20    Time 4    Period Weeks    Status Achieved      PT SHORT TERM GOAL #3   Title Pt will improve FGA score to at least a 17/30 in order to demo decr fall risk.    Baseline 14/30, 16/30 10/04/2020    Time 4    Period Weeks    Status Partially Met               PT Long Term Goals - 10/25/20 1240       PT LONG TERM GOAL #1   Title Pt will be independent in final HEP in order to build upon functional gains made in therapy. ALL STGS DUE AFTER 12 VISITS OR BY 11/05/20    Baseline currently dependent.    Time 8    Period Weeks    Status New      PT LONG TERM GOAL #2   Title Pt will  ambulate at least 250' outdoors over paved/grass  surfaces with LRAD vs. no AD in order  to demo improved community mobility.    Baseline not yet assessed.    Time 8    Period Weeks    Status New      PT LONG TERM GOAL #3   Title Pt will improve gait speed with no AD vs. LRAD to at least 2.6 ft/sec in order to demo improved community mobility.    Baseline 2.09 ft/sec with no AD - 2.9 FT/SEC on 10/25/20    Time 8    Period Weeks    Status Achieved      PT LONG TERM GOAL #4   Title Pt will improve FGA score to at least a 20/30 in order to demo decr fall risk.    Baseline 14/30 ON 09/05/20    Time 8    Period Weeks    Status New      PT LONG TERM GOAL #5   Title Pt will decr 5x sit <> stand to 22 seconds or less without BUE support in order to demo improved strength and balance.    Baseline 30.13 seconds without UE support from chair    Time 8    Period Weeks    Status New                   Plan - 10/25/20 1414     Clinical Impression Statement Began to check pt's LTGs today. Pt met LTG #3 in regards to gait speed, improved to 2.9 ft/sec with no AD (previously was 2.09 ft/sec), demonstrating improvement in community ambulation. Went over proper squatting technique with pt today using UE support and wide BOS and educated on importance of purchasing a reacher for improved safety. Remainder of session focused on balance on compliant surfaces with decr UE support. Pt tolerated well with no reports of pain. Will continue to progress towards LTGs.    Personal Factors and Comorbidities Past/Current Experience;Comorbidity 3+;Time since onset of injury/illness/exacerbation    Comorbidities PMH: HTN, peripheral neuropathy, type 2 diabetes    Examination-Activity Limitations Locomotion Level;Transfers;Squat;Stairs;Stand    Examination-Participation Restrictions Community Activity;Shop    Stability/Clinical Decision Making Stable/Uncomplicated    Rehab Potential Good    PT Frequency 1x / week   followed by 2x week for 4 weeks   PT  Duration 3 weeks   followed by 2x week for 4 weeks   PT Treatment/Interventions ADLs/Self Care Home Management;DME Instruction;Gait training;Stair training;Functional mobility training;Therapeutic activities;Therapeutic exercise;Balance training;Patient/family education;Neuromuscular re-education    PT Next Visit Plan scheduled pt for that last appt. finish checking LTGs and finalizing HEP. work on balance/gait on unlevel surfaces like grass.    PT Home Exercise Plan [email or web address]    Consulted and Agree with Plan of Care Patient             Patient will benefit from skilled therapeutic intervention in order to improve the following deficits and impairments:  Abnormal gait, Decreased balance, Decreased endurance, Decreased activity tolerance, Difficulty walking, Decreased strength, Impaired sensation  Visit Diagnosis: Unsteadiness on feet  Other abnormalities of gait and mobility  Muscle weakness (generalized)     Problem List Patient Active Problem List   Diagnosis Date Noted   Fatigue 01/12/2018   Pain due to dental caries 07/22/2015   Hypertension 06/29/2014   Shoulder pain, left 02/18/2014   Depression 02/18/2014   HLD (hyperlipidemia) 11/19/2013   Other malaise and fatigue 11/12/2013   Hidradenitis  suppurativa 12/18/2012   Health care maintenance 07/08/2012   Abnormality of gait 07/08/2012   Poorly controlled type 2 diabetes mellitus with peripheral neuropathy ([place]) 07/03/2012   Morbid obesity ([place]) 07/03/2012   Tobacco abuse 07/03/2012    Arliss Journey, PT, DPT  10/25/2020, 2:15 PM  [facility] [address] [place], [place], [address] Phone: [phone]   Fax:  [phone]  Name: Melanie Henry MRN: [id number] Date of Birth: 16-Dec-1960

## 2020-10-27 ENCOUNTER — Encounter: Payer: Self-pay | Admitting: Physical Therapy

## 2020-10-27 ENCOUNTER — Ambulatory Visit: Payer: Medicaid Other | Admitting: Physical Therapy

## 2020-10-27 ENCOUNTER — Other Ambulatory Visit: Payer: Self-pay

## 2020-10-27 DIAGNOSIS — R2681 Unsteadiness on feet: Secondary | ICD-10-CM | POA: Diagnosis not present

## 2020-10-27 DIAGNOSIS — R2689 Other abnormalities of gait and mobility: Secondary | ICD-10-CM

## 2020-10-27 NOTE — Patient Instructions (Addendum)
Access Code: [email or web address] URL: [email or web address] Date: 10/27/2020 Prepared by: Lonia Blood  Exercises Standing Marching - 2 x daily - 5 x weekly - 3 sets Standing Balance with Eyes Closed on Foam - 2 x daily - 5 x weekly - 3 sets - 30 hold Mini Squat with Chair - 2 x daily - 5 x weekly - 1-2 sets - 10 reps Tandem Walking with Counter Support - 1 x daily - 5 x weekly - 3 sets Wide Stance with Eyes Closed on Foam Pad - 1 x daily - 5 x weekly - 2 sets - 5 reps  Added to HEP 10/27/20 Standing [place] to 1/2 Tandem Stance - 1 x daily - 5 x weekly - 1 sets - 3 reps

## 2020-10-27 NOTE — Therapy (Signed)
[facility] [address] [place], [place], [address] Phone: [phone]   Fax:  [phone]  Physical Therapy Treatment  Patient Details  Name: Melanie Henry MRN: [id number] Date of Birth: Jun 26, 1960 Referring Provider (PT): Lucianne Lei, MD   Encounter Date: 10/27/2020   PT End of Session - 10/27/20 1232     Visit Number 10    Number of Visits 12   1x week for 3 weeks followed by 2x week for 4 weeks   Date for PT Re-Evaluation 11/04/20    Authorization Type [employer] - auth needed for visits 13 and beyond    PT Start Time 1233    PT Stop Time 1313    PT Time Calculation (min) 40 min    Equipment Utilized During Treatment Gait belt    Activity Tolerance Patient tolerated treatment well    Behavior During Therapy [facility] for tasks assessed/performed             Past Medical History:  Diagnosis Date   Daily headache    "for the past 2-3 wks" (07/02/2012)   Depression    Dysrhythmia    Eclampsia 1988   stroke like symtoms with pregnancy, h/o protenuria with eclampsia    Heart murmur 1988   "I had eclampsia; they said I had a stroke" (07/02/2012)   Hidradenitis suppurativa    History of juvenile arthritis    diagnosed age 49 y.o; affects hands, knees    Hypertension    Hypothyroid    Kidney stones    "once" (07/02/2012)   Migraines    "occasionally" (07/02/2012)   Obesity    Peripheral neuropathy    As of 07/2012 patient reports she was dx'ed 25 yrs ago previously taking Neurontin and B12 shot    Pneumonia ~ 1991   "once" (07/02/2012)   Tachycardia     Past Surgical History:  Procedure Laterality Date   ANKLE FUSION Right 1980's   "pinned" (07/02/2012)   [place]; [place] Right 1970's   "pinned; reattached tendon" (07/02/2012)   TONSILLECTOMY  1980   TUBAL LIGATION  03/1989; 06/1990    There were no vitals filed for this visit.   Subjective Assessment - 10/27/20 1232      Subjective Nothing new; I did have a fall over last weekend on the grass.  Didn't get hurt and was able to get up on my own.    Pertinent History PMH: HTN, peripheral neuropathy, type 2 diabetes    Limitations Walking;Standing    Patient Stated Goals wants to build up her strength, find some safe exercises.    Currently in Pain? No/denies                [facility] PT Assessment - 10/27/20 0001       Functional Gait  Assessment   Gait assessed  Yes    Gait Level Surface Walks 20 ft in less than 7 sec but greater than 5.5 sec, uses assistive device, slower speed, mild gait deviations, or deviates 6-10 in outside of the 12 in walkway width.   6.87   Change in Gait Speed Able to change speed, demonstrates mild gait deviations, deviates 6-10 in outside of the 12 in walkway width, or no gait deviations, unable to achieve a major change in velocity, or uses a change in velocity, or uses an assistive device.    Gait with Horizontal Head Turns Performs head turns smoothly with slight  change in gait velocity (eg, minor disruption to smooth gait path), deviates 6-10 in outside 12 in walkway width, or uses an assistive device.   8.41   Gait with Vertical Head Turns Performs task with slight change in gait velocity (eg, minor disruption to smooth gait path), deviates 6 - 10 in outside 12 in walkway width or uses assistive device   8.1   Gait and Pivot Turn Pivot turns safely within 3 sec and stops quickly with no loss of balance.    Step Over Obstacle Is able to step over one shoe box (4.5 in total height) but must slow down and adjust steps to clear box safely. May require verbal cueing.    Gait with Narrow Base of Support Is able to ambulate for 10 steps heel to toe with no staggering.    Gait with Eyes Closed Walks 20 ft, slow speed, abnormal gait pattern, evidence for imbalance, deviates 10-15 in outside 12 in walkway width. Requires more than 9 sec to ambulate 20 ft.   9.5   Ambulating Backwards Walks  20 ft, uses assistive device, slower speed, mild gait deviations, deviates 6-10 in outside 12 in walkway width.   14.10   Steps Alternating feet, must use rail.    Total Score 20    FGA comment: Improved from 16/30                           [facility] Adult PT Treatment/Exercise - 10/27/20 0001       Ambulation/Gait   Ambulation/Gait Yes    Ambulation/Gait Assistance 5: Supervision    Ambulation/Gait Assistance Details Negotiating low obstacles and ramp (did not go outside due to extreme heat)    Ambulation Distance (Feet) 400 Feet    Assistive device None    Gait Pattern Step-through pattern;Decreased stance time - right;Decreased hip/knee flexion - right;Decreased hip/knee flexion - left    Ambulation Surface Level;Indoor      High Level Balance   High Level Balance Comments Pt stands EO/EC 30 seconds      Self-Care   Self-Care Other Self-Care Comments    Other Self-Care Comments  REviewed fall prevention in relation to FGA score (20/30) and increased sway with EO and EC on foam surfaces.  Educated pt to use cane with outdoor surfaces.                 Balance Exercises - 10/27/20 0001       Balance Exercises: Standing   Standing Eyes Opened Foam/compliant surface;Limitations;Wide (BOA);Narrow base of support (BOS)    Standing Eyes Opened Limitations Head turns/head nods x 5    Standing Eyes Closed Foam/compliant surface;Limitations    Standing Eyes Closed Limitations Reviewed HEP exercise:  wide BOS and head turns/nods x 5 reps with eyes closed    Partial Tandem Stance Eyes open;Eyes closed;Upper extremity support 2;Foam/compliant surface    Partial Tandem Stance Limitations Head turns/nods x 5 reps with EO, then EC head steady x 15 seconds; each foot position.               PT Education - 10/27/20 1312     Education Details Update to HEP-see instructions    Methods Explanation;Demonstration    Comprehension Verbalized understanding;Returned  demonstration              PT Short Term Goals - 10/04/20 1422       PT SHORT TERM GOAL #1   Title  Pt will be independent in initial HEP in order to build upon functional gains made in therapy. ALL STGS DUE AFTER 3 VISITS OR BY 10/03/20    Baseline currently dependent.    Time 4   VISITS   Period Weeks    Status Achieved    Target Date 10/03/20      PT SHORT TERM GOAL #2   Title Pt will decr 5x sit <> stand to 26 seconds or less without BUE support in order to demo improved strength and balance.    Baseline 30.13 seconds without UE support from chair; 24.22 sec 10/04/20    Time 4    Period Weeks    Status Achieved      PT SHORT TERM GOAL #3   Title Pt will improve FGA score to at least a 17/30 in order to demo decr fall risk.    Baseline 14/30, 16/30 10/04/2020    Time 4    Period Weeks    Status Partially Met               PT Long Term Goals - 10/27/20 1505       PT LONG TERM GOAL #1   Title Pt will be independent in final HEP in order to build upon functional gains made in therapy. ALL STGS DUE AFTER 12 VISITS OR BY 11/05/20    Baseline ongoing additions, 10/27/20    Time 8    Period Weeks    Status On-going      PT LONG TERM GOAL #2   Title Pt will ambulate at least 250' outdoors over paved/grass surfaces with LRAD vs. no AD in order  to demo improved community mobility.    Baseline not yet assessed.    Time 8    Period Weeks    Status New      PT LONG TERM GOAL #3   Title Pt will improve gait speed with no AD vs. LRAD to at least 2.6 ft/sec in order to demo improved community mobility.    Baseline 2.09 ft/sec with no AD - 2.9 FT/SEC on 10/25/20    Time 8    Period Weeks    Status Achieved      PT LONG TERM GOAL #4   Title Pt will improve FGA score to at least a 20/30 in order to demo decr fall risk.    Baseline 14/30 ON 09/05/20; 20/30 10/27/20    Time 8    Period Weeks    Status Achieved      PT LONG TERM GOAL #5   Title Pt will decr 5x sit <> stand to  22 seconds or less without BUE support in order to demo improved strength and balance.    Baseline 30.13 seconds without UE support from chair    Time 8    Period Weeks    Status New                   Plan - 10/27/20 1506     Clinical Impression Statement Assessed FGA this visit, with pt continuing to make improvements and meeting LTG for FGA.  She still is at risk for falls, and discussed compensations for her balance deficits-cane use, extra lighting in home environment, etc.  Progressed HEP with corner balance exercises.  Pt is progressing towards remaining LTGs and should be ready for discharge next visit.    Personal Factors and Comorbidities Past/Current Experience;Comorbidity 3+;Time since onset of injury/illness/exacerbation  Comorbidities PMH: HTN, peripheral neuropathy, type 2 diabetes    Examination-Activity Limitations Locomotion Level;Transfers;Squat;Stairs;Stand    Examination-Participation Restrictions Community Activity;Shop    Stability/Clinical Decision Making Stable/Uncomplicated    Rehab Potential Good    PT Frequency 1x / week   followed by 2x week for 4 weeks   PT Duration 3 weeks   followed by 2x week for 4 weeks   PT Treatment/Interventions ADLs/Self Care Home Management;DME Instruction;Gait training;Stair training;Functional mobility training;Therapeutic activities;Therapeutic exercise;Balance training;Patient/family education;Neuromuscular re-education    PT Next Visit Plan Check remaining goals and plan for d/c.    PT Home Exercise Plan [id number]    Consulted and Agree with Plan of Care Patient             Patient will benefit from skilled therapeutic intervention in order to improve the following deficits and impairments:  Abnormal gait, Decreased balance, Decreased endurance, Decreased activity tolerance, Difficulty walking, Decreased strength, Impaired sensation  Visit Diagnosis: Unsteadiness on feet  Other abnormalities of gait and  mobility     Problem List Patient Active Problem List   Diagnosis Date Noted   Fatigue 01/12/2018   Pain due to dental caries 07/22/2015   Hypertension 06/29/2014   Shoulder pain, left 02/18/2014   Depression 02/18/2014   HLD (hyperlipidemia) 11/19/2013   Other malaise and fatigue 11/12/2013   Hidradenitis suppurativa 12/18/2012   Health care maintenance 07/08/2012   Abnormality of gait 07/08/2012   Poorly controlled type 2 diabetes mellitus with peripheral neuropathy ([place]) 07/03/2012   Morbid obesity ([place]) 07/03/2012   Tobacco abuse 07/03/2012    Bexton Haak W. 10/27/2020, 3:17 PM Frazier Butt., PT  [facility] [address] [place], [place], [address] Phone: [phone]   Fax:  [phone]  Name: Melanie Henry MRN: [id number] Date of Birth: 16-Feb-1961

## 2020-11-02 ENCOUNTER — Other Ambulatory Visit: Payer: Self-pay

## 2020-11-02 ENCOUNTER — Ambulatory Visit: Payer: Medicaid Other | Attending: Family Medicine | Admitting: Physical Therapy

## 2020-11-02 ENCOUNTER — Encounter: Payer: Self-pay | Admitting: Physical Therapy

## 2020-11-02 DIAGNOSIS — R2681 Unsteadiness on feet: Secondary | ICD-10-CM | POA: Diagnosis present

## 2020-11-02 DIAGNOSIS — R2689 Other abnormalities of gait and mobility: Secondary | ICD-10-CM | POA: Diagnosis present

## 2020-11-02 NOTE — Therapy (Signed)
[facility] [address] [place], [place], [address] Phone: [phone]   Fax:  [phone]  Physical Therapy Treatment/Discharge Summary  Patient Details  Name: Melanie Henry MRN: [id number] Date of Birth: Nov 25, 1960 Referring Provider (PT): Lucianne Lei, MD    PHYSICAL THERAPY DISCHARGE SUMMARY  Visits from Start of Care: 11  Current functional level related to goals / functional outcomes:  PT Long Term Goals - 11/02/20 1824       PT LONG TERM GOAL #1   Title Pt will be independent in final HEP in order to build upon functional gains made in therapy. ALL STGS DUE AFTER 12 VISITS OR BY 11/05/20    Baseline ongoing additions, 10/27/20    Time 8    Period Weeks    Status Achieved      PT LONG TERM GOAL #2   Title Pt will ambulate at least 250' outdoors over paved/grass surfaces with LRAD vs. no AD in order  to demo improved community mobility.    Baseline not yet assessed.    Time 8    Period Weeks    Status Achieved      PT LONG TERM GOAL #3   Title Pt will improve gait speed with no AD vs. LRAD to at least 2.6 ft/sec in order to demo improved community mobility.    Baseline 2.09 ft/sec with no AD - 2.9 FT/SEC on 10/25/20    Time 8    Period Weeks    Status Achieved      PT LONG TERM GOAL #4   Title Pt will improve FGA score to at least a 20/30 in order to demo decr fall risk.    Baseline 14/30 ON 09/05/20; 20/30 10/27/20    Time 8    Period Weeks    Status Achieved      PT LONG TERM GOAL #5   Title Pt will decr 5x sit <> stand to 22 seconds or less without BUE support in order to demo improved strength and balance.    Baseline 30.13 seconds without UE support from chair; 15.78 sec 11/02/20    Time 8    Period Weeks    Status Achieved            Pt has met all 5 of 5 LTGs.   Remaining deficits: High level balance deficits on unlevel surfaces-pt knows to use cane.     Education / Equipment: Educated in  [employer], fall prevention.   Patient agrees to discharge. Patient goals were met. Patient is being discharged due to meeting the stated rehab goals.  Mady Haagensen, PT 11/02/20 6:29 PM Phone: [phone] Fax: [phone]   Encounter Date: 11/02/2020   PT End of Session - 11/02/20 1823     Visit Number 11    Number of Visits 12   1x week for 3 weeks followed by 2x week for 4 weeks   Date for PT Re-Evaluation 11/04/20    Authorization Type [employer] - auth needed for visits 13 and beyond    PT Start Time 1747    PT Stop Time 1814    PT Time Calculation (min) 27 min    Activity Tolerance Patient tolerated treatment well    Behavior During Therapy [facility] for tasks assessed/performed             Past Medical History:  Diagnosis Date   Daily headache    "for the past 2-3 wks" (07/02/2012)   Depression  Dysrhythmia    Eclampsia 1988   stroke like symtoms with pregnancy, h/o protenuria with eclampsia    Heart murmur 1988   "I had eclampsia; they said I had a stroke" (07/02/2012)   Hidradenitis suppurativa    History of juvenile arthritis    diagnosed age 89 y.o; affects hands, knees    Hypertension    Hypothyroid    Kidney stones    "once" (07/02/2012)   Migraines    "occasionally" (07/02/2012)   Obesity    Peripheral neuropathy    As of 07/2012 patient reports she was dx'ed 25 yrs ago previously taking Neurontin and B12 shot    Pneumonia ~ 1991   "once" (07/02/2012)   Tachycardia     Past Surgical History:  Procedure Laterality Date   ANKLE FUSION Right 1980's   "pinned" (07/02/2012)   [place]; [place] Right 1970's   "pinned; reattached tendon" (07/02/2012)   TONSILLECTOMY  1980   TUBAL LIGATION  03/1989; 06/1990    There were no vitals filed for this visit.   Subjective Assessment - 11/02/20 1751     Subjective Feel like my balance is a little better, because I'm able to stand on the pillow with better balance.    Pertinent History  PMH: HTN, peripheral neuropathy, type 2 diabetes    Limitations Walking;Standing    Patient Stated Goals wants to build up her strength, find some safe exercises.    Currently in Pain? No/denies                               [facility] Adult PT Treatment/Exercise - 11/02/20 0001       Transfers   Transfers Sit to Stand;Stand to Sit    Sit to Stand Without upper extremity assist;From chair/3-in-1;6: Modified independent (Device/Increase time)    Five time sit to stand comments  15.78    Stand to Sit 6: Modified independent (Device/Increase time)      Ambulation/Gait   Ambulation/Gait Yes    Ambulation/Gait Assistance 6: Modified independent (Device/Increase time)    Ambulation/Gait Assistance Details Outdoor surfaces including sidewalk and 8 ft area of grass with cane    Ambulation Distance (Feet) 400 Feet    Assistive device Straight cane    Gait Pattern Step-through pattern;Decreased stance time - right;Decreased hip/knee flexion - right;Decreased hip/knee flexion - left    Ambulation Surface Unlevel;Outdoor;Paved;Grass    Gait Comments Cues for safety with negotiating ramps, grass-step length, foot clearance, deliberate cane use, slowed pace.                 Balance Exercises - 11/02/20 0001       Balance Exercises: Standing   Partial Tandem Stance Eyes open;Eyes closed;Upper extremity support 2;Foam/compliant surface    Partial Tandem Stance Limitations Head turns/nods x 5 reps with EO, then EC head steady x 15 seconds; each foot position.   Review of HEP addition from last visit.  Pt return demo understanding.   Other Standing Exercises Tandem gait forward x 2 laps at counter, marching forward/back x 1 lap at counter; tandem march forward 1 lap at counter.            Discussed importance of continuing HEP, even with therapy discharging.   PT Education - 11/02/20 1822     Education Details Progress towards goals, improvements in functional  measures, plans for d/c this visit.  Person(s) Educated Patient    Methods Explanation    Comprehension Verbalized understanding              PT Short Term Goals - 10/04/20 1422       PT SHORT TERM GOAL #1   Title Pt will be independent in initial HEP in order to build upon functional gains made in therapy. ALL STGS DUE AFTER 3 VISITS OR BY 10/03/20    Baseline currently dependent.    Time 4   VISITS   Period Weeks    Status Achieved    Target Date 10/03/20      PT SHORT TERM GOAL #2   Title Pt will decr 5x sit <> stand to 26 seconds or less without BUE support in order to demo improved strength and balance.    Baseline 30.13 seconds without UE support from chair; 24.22 sec 10/04/20    Time 4    Period Weeks    Status Achieved      PT SHORT TERM GOAL #3   Title Pt will improve FGA score to at least a 17/30 in order to demo decr fall risk.    Baseline 14/30, 16/30 10/04/2020    Time 4    Period Weeks    Status Partially Met               PT Long Term Goals - 11/02/20 1824       PT LONG TERM GOAL #1   Title Pt will be independent in final HEP in order to build upon functional gains made in therapy. ALL STGS DUE AFTER 12 VISITS OR BY 11/05/20    Baseline ongoing additions, 10/27/20    Time 8    Period Weeks    Status Achieved      PT LONG TERM GOAL #2   Title Pt will ambulate at least 250' outdoors over paved/grass surfaces with LRAD vs. no AD in order  to demo improved community mobility.    Baseline not yet assessed.    Time 8    Period Weeks    Status Achieved      PT LONG TERM GOAL #3   Title Pt will improve gait speed with no AD vs. LRAD to at least 2.6 ft/sec in order to demo improved community mobility.    Baseline 2.09 ft/sec with no AD - 2.9 FT/SEC on 10/25/20    Time 8    Period Weeks    Status Achieved      PT LONG TERM GOAL #4   Title Pt will improve FGA score to at least a 20/30 in order to demo decr fall risk.    Baseline 14/30 ON 09/05/20; 20/30  10/27/20    Time 8    Period Weeks    Status Achieved      PT LONG TERM GOAL #5   Title Pt will decr 5x sit <> stand to 22 seconds or less without BUE support in order to demo improved strength and balance.    Baseline 30.13 seconds without UE support from chair; 15.78 sec 11/02/20    Time 8    Period Weeks    Status Achieved                   Plan - 11/02/20 1825     Clinical Impression Statement Assessed remaining goals this visit, with pt meeting all LTGs.  LTG 1 met for HEP; LTG 2 met for outdoor gait.  LTG 5  met for improved 5x sit<>stand.  Pt has made significant improvements in functional strength and balance measures since eval.  She is pleased with her progress and she is apprpopriate for d/c this visit.    Personal Factors and Comorbidities Past/Current Experience;Comorbidity 3+;Time since onset of injury/illness/exacerbation    Comorbidities PMH: HTN, peripheral neuropathy, type 2 diabetes    Examination-Activity Limitations Locomotion Level;Transfers;Squat;Stairs;Stand    Examination-Participation Restrictions Community Activity;Shop    Stability/Clinical Decision Making Stable/Uncomplicated    Rehab Potential Good    PT Frequency 1x / week   followed by 2x week for 4 weeks   PT Duration 3 weeks   followed by 2x week for 4 weeks   PT Treatment/Interventions ADLs/Self Care Home Management;DME Instruction;Gait training;Stair training;Functional mobility training;Therapeutic activities;Therapeutic exercise;Balance training;Patient/family education;Neuromuscular re-education    PT Next Visit Plan Discharge this visit.    PT Home Exercise Plan [id number]    Consulted and Agree with Plan of Care Patient             Patient will benefit from skilled therapeutic intervention in order to improve the following deficits and impairments:  Abnormal gait, Decreased balance, Decreased endurance, Decreased activity tolerance, Difficulty walking, Decreased strength, Impaired  sensation  Visit Diagnosis: Other abnormalities of gait and mobility  Unsteadiness on feet     Problem List Patient Active Problem List   Diagnosis Date Noted   Fatigue 01/12/2018   Pain due to dental caries 07/22/2015   Hypertension 06/29/2014   Shoulder pain, left 02/18/2014   Depression 02/18/2014   HLD (hyperlipidemia) 11/19/2013   Other malaise and fatigue 11/12/2013   Hidradenitis suppurativa 12/18/2012   Health care maintenance 07/08/2012   Abnormality of gait 07/08/2012   Poorly controlled type 2 diabetes mellitus with peripheral neuropathy ([place]) 07/03/2012   Morbid obesity ([place]) 07/03/2012   Tobacco abuse 07/03/2012    Argenis Kumari W. 11/02/2020, 6:28 PM Frazier Butt., PT  [facility] [address] [place], [place], [address] Phone: [phone]   Fax:  [phone]  Name: Karren Newland MRN: [id number] Date of Birth: 1960-05-27

## 2022-05-31 ENCOUNTER — Ambulatory Visit (HOSPITAL_COMMUNITY)
Admission: EM | Admit: 2022-05-31 | Discharge: 2022-05-31 | Disposition: A | Discharge status: Home / Self Care | Payer: Medicaid Other | Attending: Family Medicine | Admitting: Family Medicine

## 2022-05-31 ENCOUNTER — Encounter (HOSPITAL_COMMUNITY): Payer: Self-pay | Admitting: *Deleted

## 2022-05-31 DIAGNOSIS — K1120 Sialoadenitis, unspecified: Secondary | ICD-10-CM | POA: Diagnosis not present

## 2022-05-31 MED ORDER — CLINDAMYCIN HCL 300 MG PO CAPS: 300.0000 mg | ORAL_CAPSULE | Freq: Three times a day (TID) | ORAL | 0 refills | Status: AC

## 2022-05-31 NOTE — ED Provider Notes (Signed)
[place]    CSN: [id number] Arrival date & time: 05/31/22  1336      History   Chief Complaint Chief Complaint  Patient presents with   Lymphadenopathy    HPI Melanie Henry is a 62 y.o. female.   Her salivary glands on the right side of her face are painful and swollen.  Started about 2-3 days ago, worsened yesterday.  She rinsed with peroxide which helped a bit.  It is worse with eating.  This has happened in the past a long time ago and this feels very similar.  No fevers/chills.  No sore throat.  More painful/tender in the neck and radiating into the ear.        Past Medical History:  Diagnosis Date   Daily headache    "for the past 2-3 wks" (07/02/2012)   Depression    Dysrhythmia    Eclampsia 1988   stroke like symtoms with pregnancy, h/o protenuria with eclampsia    Heart murmur 1988   "I had eclampsia; they said I had a stroke" (07/02/2012)   Hidradenitis suppurativa    History of juvenile arthritis    diagnosed age 86 y.o; affects hands, knees    Hypertension    Hypothyroid    Kidney stones    "once" (07/02/2012)   Migraines    "occasionally" (07/02/2012)   Obesity    Peripheral neuropathy    As of 07/2012 patient reports she was dx'ed 25 yrs ago previously taking Neurontin and B12 shot    Pneumonia ~ 1991   "once" (07/02/2012)   Tachycardia     Patient Active Problem List   Diagnosis Date Noted   Fatigue 01/12/2018   Pain due to dental caries 07/22/2015   Hypertension 06/29/2014   Shoulder pain, left 02/18/2014   Depression 02/18/2014   HLD (hyperlipidemia) 11/19/2013   Other malaise and fatigue 11/12/2013   Hidradenitis suppurativa 12/18/2012   Health care maintenance 07/08/2012   Abnormality of gait 07/08/2012   Poorly controlled type 2 diabetes mellitus with peripheral neuropathy ([place]) 07/03/2012   Morbid obesity ([place]) 07/03/2012   Tobacco abuse 07/03/2012    Past Surgical History:  Procedure Laterality Date   ANKLE FUSION Right  1980's   "pinned" (07/02/2012)   [place]; [place] Right 1970's   "pinned; reattached tendon" (07/02/2012)   TONSILLECTOMY  1980   TUBAL LIGATION  03/1989; 06/1990    OB History   No obstetric history on file.      Home Medications    Prior to Admission medications   Medication Sig Start Date End Date Taking? Authorizing Provider  buPROPion ER (WELLBUTRIN SR) 100 MG 12 hr tablet Take 100 mg by mouth 2 (two) times daily.   Yes [provider]  losartan-hydrochlorothiazide (HYZAAR) 50-12.5 MG tablet Take 1 tablet by mouth daily. 04/19/22  Yes [provider]  Multiple Vitamin (MULTIVITAMIN WITH MINERALS) TABS Take 1 tablet by mouth daily.   Yes [provider]  TRULICITY 1.5 0000000 SOPN Inject 1.5 mg into the skin once a week.   Yes [provider]  acetaminophen (TYLENOL) 500 MG tablet Take 500 mg by mouth every 6 (six) hours as needed for moderate pain.    [provider]  glipiZIDE (GLUCOTROL XL) 5 MG 24 hr tablet Take 1 tablet (5 mg total) by mouth daily with breakfast. 09/30/17 10/30/17  Isabelle Course, MD  insulin NPH-regular Human (HUMULIN 70/30) (70-30) 100 UNIT/ML injection Inject  twice a day, 17 units in AM and 15 units in evening. Patient not taking: Reported on 09/05/2020 07/22/15   Jones Bales, MD  Insulin Syringe-Needle U-100 31G X 5/16" 0.3 ML MISC Use three times daily for insulin injection. diag code 250.02. Insulin dependent Patient not taking: Reported on 09/05/2020 09/01/12   Otho Bellows, MD  L-Lysine 500 MG TABS Take by mouth. 500 mg tablet 2x/day    [provider]  rosuvastatin (CRESTOR) 5 MG tablet Take 5 mg by mouth daily.    [provider]  zolpidem (AMBIEN) 5 MG tablet Take 1 tablet (5 mg total) by mouth at bedtime as needed for sleep. Patient not taking: Reported on 09/05/2020 04/03/16   Maryellen Pile, MD    Family History Family History  Problem Relation Age of Onset    Diabetes Paternal Uncle    Glaucoma Maternal Grandmother     Social History Social History   Tobacco Use   Smoking status: Some Days    Packs/day: 0.25    Years: 30.00    Total pack years: 7.50    Types: Cigarettes   Smokeless tobacco: Never   Tobacco comments:    DOWN TO 4-5 A DAY  Vaping Use   Vaping Use: Never used  Substance Use Topics   Alcohol use: Yes    Alcohol/week: 0.0 standard drinks of alcohol    Comment: Rarely.   Drug use: No     Allergies   Tetracyclines & related, Aspirin, and Metformin and related   Review of Systems Review of Systems  Constitutional: Negative.   HENT:  Positive for facial swelling. Negative for congestion and rhinorrhea.   Respiratory: Negative.    Cardiovascular: Negative.   Gastrointestinal: Negative.   Musculoskeletal: Negative.   Psychiatric/Behavioral: Negative.       Physical Exam Triage Vital Signs ED Triage Vitals  Enc Vitals Group     BP 05/31/22 1442 (!) 140/84     Pulse Rate 05/31/22 1442 86     Resp 05/31/22 1442 20     Temp 05/31/22 1442 98.4 F (36.9 C)     Temp Source 05/31/22 1442 Oral     SpO2 05/31/22 1442 97 %     Weight --      Height --      Head Circumference --      Peak Flow --      Pain Score 05/31/22 1439 5     Pain Loc --      Pain Edu? --      Excl. in [place]? --    No data found.  Updated Vital Signs BP (!) 140/84 (BP Location: Right Arm)   Pulse 86   Temp 98.4 F (36.9 C) (Oral)   Resp 20   SpO2 97%   Visual Acuity Right Eye Distance:   Left Eye Distance:   Bilateral Distance:    Right Eye Near:   Left Eye Near:    Bilateral Near:     Physical Exam Constitutional:      Appearance: Normal appearance.  HENT:     Right Ear: Tympanic membrane normal.     Left Ear: Tympanic membrane normal.     Nose: Nose normal. No congestion or rhinorrhea.     Mouth/Throat:     Mouth: Mucous membranes are moist.     Pharynx: No oropharyngeal exudate or posterior oropharyngeal erythema.   Neck:     Comments: Tenderness and swelling at the right submandibular area  Cardiovascular:     Rate and Rhythm: Normal rate and regular rhythm.  Pulmonary:     Effort: Pulmonary effort is normal.  Musculoskeletal:     Cervical back: Normal range of motion. Tenderness present.  Neurological:     General: No focal deficit present.     Mental Status: She is alert.  Psychiatric:        Mood and Affect: Mood normal.      UC Treatments / Results  Labs (all labs ordered are listed, but only abnormal results are displayed) Labs Reviewed - No data to display  EKG   Radiology No results found.  Procedures Procedures (including critical care time)  Medications Ordered in UC Medications - No data to display  Initial Impression / Assessment and Plan / UC Course  I have reviewed the triage vital signs and the nursing notes.  Pertinent labs & imaging results that were available during my care of the patient were reviewed by me and considered in my medical decision making (see chart for details).   Final Clinical Impressions(s) / UC Diagnoses   Final diagnoses:  Salivary gland infection     Discharge Instructions      You were seen today for an infection of the salivary gland.  I have sent out clindamycin to take three times/day x 7 days.  You may trial warm compresses to the area as well.  I recommend you use lemon drops or other hard candies to help as well.     ED Prescriptions     Medication Sig Dispense Auth. Provider   clindamycin (CLEOCIN) 300 MG capsule Take 1 capsule (300 mg total) by mouth 3 (three) times daily for 7 days. 21 capsule Rondel Oh, MD      PDMP not reviewed this encounter.   Rondel Oh, MD 05/31/22 1459

## 2022-05-31 NOTE — ED Triage Notes (Signed)
Pt states that 2-3 days ago her right side of her neck started to swell.She did rinse with peroxide and it helped the pain a little.

## 2022-05-31 NOTE — Discharge Instructions (Signed)
You were seen today for an infection of the salivary gland.  I have sent out clindamycin to take three times/day x 7 days.  You may trial warm compresses to the area as well.  I recommend you use lemon drops or other hard candies to help as well.

## 2023-01-27 LAB — LAB REPORT - SCANNED
A1c: 7.4
EGFR: 76

## 2023-08-08 ENCOUNTER — Ambulatory Visit: Admitting: Podiatry

## 2023-08-16 ENCOUNTER — Ambulatory Visit: Admitting: Podiatry

## 2023-08-16 ENCOUNTER — Encounter: Payer: Self-pay | Admitting: Podiatry

## 2023-08-16 DIAGNOSIS — M79672 Pain in left foot: Secondary | ICD-10-CM | POA: Diagnosis not present

## 2023-08-16 DIAGNOSIS — L600 Ingrowing nail: Secondary | ICD-10-CM

## 2023-08-16 DIAGNOSIS — B351 Tinea unguium: Secondary | ICD-10-CM

## 2023-08-16 DIAGNOSIS — M79671 Pain in right foot: Secondary | ICD-10-CM

## 2023-08-16 NOTE — Progress Notes (Signed)
 Patient presents for evaluation and treatment of tenderness and some redness around nails feet.  Tenderness around toes with walking and wearing shoes.  Physical exam:  General appearance: Alert, pleasant, and in no acute distress.  Vascular: Pedal pulses: DP palpable bilaterally, PT nonpalpable bilaterally bilaterally.  Mild edema lower legs bilaterally  Neurological:  No paresthesias or burning noted.  Dermatologic:  Nails thickened, disfigured, discolored 1-5 BL with subungual debris.  Redness and hypertrophic nail folds along nail folds bilaterally but no signs of drainage or infection.   Musculoskeletal:     Diagnosis: 1.  Painful onychomycotic nails 1 through 5 bilaterally. 2. Pain toes 1 through 5 bilaterally. 3.  Ingrown nails toes bilaterally  Plan: Debrided onychomycotic nails 1 through 5 bilaterally.  Return 3 months

## 2023-09-27 ENCOUNTER — Ambulatory Visit: Payer: Medicaid Other | Attending: Internal Medicine | Admitting: Internal Medicine

## 2023-09-27 ENCOUNTER — Encounter: Payer: Self-pay | Admitting: Internal Medicine

## 2023-09-27 ENCOUNTER — Ambulatory Visit

## 2023-09-27 VITALS — BP 119/74 | HR 80 | Resp 16 | Ht 63.0 in | Wt 209.0 lb

## 2023-09-27 DIAGNOSIS — G8929 Other chronic pain: Secondary | ICD-10-CM | POA: Insufficient documentation

## 2023-09-27 DIAGNOSIS — E1165 Type 2 diabetes mellitus with hyperglycemia: Secondary | ICD-10-CM | POA: Diagnosis present

## 2023-09-27 DIAGNOSIS — R768 Other specified abnormal immunological findings in serum: Secondary | ICD-10-CM | POA: Diagnosis present

## 2023-09-27 DIAGNOSIS — E1142 Type 2 diabetes mellitus with diabetic polyneuropathy: Secondary | ICD-10-CM | POA: Insufficient documentation

## 2023-09-27 DIAGNOSIS — L732 Hidradenitis suppurativa: Secondary | ICD-10-CM | POA: Diagnosis present

## 2023-09-27 DIAGNOSIS — M25512 Pain in left shoulder: Secondary | ICD-10-CM | POA: Insufficient documentation

## 2023-09-27 DIAGNOSIS — M79641 Pain in right hand: Secondary | ICD-10-CM | POA: Insufficient documentation

## 2023-09-27 DIAGNOSIS — G5601 Carpal tunnel syndrome, right upper limb: Secondary | ICD-10-CM | POA: Insufficient documentation

## 2023-09-27 DIAGNOSIS — M79642 Pain in left hand: Secondary | ICD-10-CM | POA: Insufficient documentation

## 2023-09-27 DIAGNOSIS — Z79899 Other long term (current) drug therapy: Secondary | ICD-10-CM | POA: Insufficient documentation

## 2023-09-27 NOTE — Progress Notes (Signed)
 Office Visit Note  Patient: Melanie Henry             Date of Birth: Oct 06, 1960           MRN: [id number]             PCP: Sebastian Jerilyn HERO, FNP Referring: Sebastian Jerilyn HERO, * Visit Date: 09/27/2023   Subjective:  New Patient (Initial Visit) (Labs )   Discussed the use of AI scribe software for clinical note transcription with the patient, who gave verbal consent to proceed.  History of Present Illness   Melanie Henry is a 63 year old female with hidradenitis suppurativa here for evaluation with joint pain in multiple areas and elevated serum inflammatory markers.  She has experienced increased stiffness and swelling in her hands over the past two years, significantly impacting her daily activities. She has difficulty gripping objects, such as a frying pan, and her hands sometimes become weak and do not close fully. Numbness and a burning sensation in her knuckles occur, particularly after activities like crocheting, which she can only do for about ten minutes before her hand goes numb.  Her knees and ankles have also been more symptomatic over the last year, with the right ankle recently becoming stiff and painful. While her knees do not swell significantly, they do experience crunching and popping sounds. 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 Currently, she takes Tylenol, two tablets twice or sometimes three times a day, to manage her symptoms.  She has been dealing with hidradenitis suppurativa since she was 30, although she only learned the name of the condition about 8 to 10 years ago. She has not seen a dermatologist but has consulted a surgeon regarding potential surgery for severe spots. She has been treated with antibiotics, typically clindamycin , for infections that occur periodically, with one area draining steadily for about six to seven  years.  She also reports issues with her shoulder, which sometimes gets stuck and was previously treated with physical therapy. She describes a crackling sensation when rotating the shoulder. During the review of symptoms, she mentions experiencing tingling and numbness in her hands, particularly when flexing or engaging her hands for extended periods.      Labs reviewed 01/2023 ANA 1:80 homogenous ESR 58 CRP 30.7  12/2017 HCV neg   Activities of Daily Living:  Patient reports morning stiffness for 30 minutes.   Patient Reports nocturnal pain.  Difficulty dressing/grooming: Denies Difficulty climbing stairs: Reports Difficulty getting out of chair: Denies Difficulty using hands for taps, buttons, cutlery, and/or writing: Reports  Review of Systems  Constitutional:  Positive for fatigue.  HENT:  Negative for mouth sores and mouth dryness.   Eyes:  Positive for dryness.  Respiratory:  Negative for shortness of breath.   Cardiovascular:  Positive for chest pain. Negative for palpitations.  Gastrointestinal:  Negative for blood in stool, constipation and diarrhea.  Endocrine: Negative for increased urination.  Genitourinary:  Negative for involuntary urination.  Musculoskeletal:  Positive for joint pain, gait problem, joint pain, joint swelling, muscle weakness and morning stiffness. Negative for myalgias, muscle tenderness and myalgias.  Skin:  Positive for rash, hair loss and sensitivity to sunlight. Negative for color change.  Allergic/Immunologic: Negative for susceptible to infections.  Neurological:  Positive for headaches. Negative for dizziness.  Hematological:  Negative for swollen glands.  Psychiatric/Behavioral:  Positive for depressed mood  and sleep disturbance. The patient is nervous/anxious.     PMFS History:  Patient Active Problem List   Diagnosis Date Noted   Carpal tunnel syndrome, right upper limb 09/27/2023   High risk medication use 09/27/2023   Fatigue  01/12/2018   Pain due to dental caries 07/22/2015   Hypertension 06/29/2014   Shoulder pain, left 02/18/2014   Depression 02/18/2014   HLD (hyperlipidemia) 11/19/2013   Other malaise and fatigue 11/12/2013   Hidradenitis suppurativa 12/18/2012   Health care maintenance 07/08/2012   Abnormality of gait 07/08/2012   Poorly controlled type 2 diabetes mellitus with peripheral neuropathy ([facility]) 07/03/2012   Morbid obesity ([facility]) 07/03/2012   Tobacco abuse 07/03/2012    Past Medical History:  Diagnosis Date   Allergy    Daily headache    for the past 2-3 wks (07/02/2012)   Depression    Dysrhythmia    Eclampsia 04/02/1986   stroke like symtoms with pregnancy, h/o protenuria with eclampsia    Heart murmur 04/02/1986   I had eclampsia; they said I had a stroke (07/02/2012)   Hidradenitis suppurativa    History of juvenile arthritis    diagnosed age 4 y.o; affects hands, knees    Hypertension    Hypothyroid    Kidney stones    once (07/02/2012)   Migraines    occasionally (07/02/2012)   Obesity    Peripheral neuropathy    As of 07/2012 patient reports she was dx'ed 25 yrs ago previously taking Neurontin and B12 shot    Pneumonia ~ 1991   once (07/02/2012)   Tachycardia     Family History  Problem Relation Age of Onset   Cancer Mother        breast cancer x4   Osteoarthritis Mother    Heart failure Mother    Diabetes Father    Liver disease Father    AAA (abdominal aortic aneurysm) Father    Healthy Sister    Glaucoma Maternal Grandmother    Diabetes Paternal Uncle    Past Surgical History:  Procedure Laterality Date   ANKLE FUSION Right 12/02/1978   pinned (07/02/2012)   CESAREAN SECTION  1988; 1990   DENTAL SURGERY  2004   THUMB FUSION Right 12/01/1968   pinned; reattached tendon (07/02/2012)   TONSILLECTOMY  04/02/1978   TOOTH EXTRACTION  2021   x2   TUBAL LIGATION  03/1989; 06/1990   x2   Social History   Social History Narrative   2 kids   Smokes 4-5  cigarettes daily.  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   Objective: Vital Signs: BP 119/74 (BP Location: Right Arm, Patient Position: Sitting, Cuff Size: Normal)   Pulse 80   Resp 16   Ht 5' 3 (1.6 m)   Wt 209 lb (94.8 kg)   BMI 37.02 kg/m    Physical Exam Constitutional:      Appearance: She is obese.   Eyes:     Conjunctiva/sclera: Conjunctivae normal.    Cardiovascular:     Rate and Rhythm: Normal rate and regular rhythm.  Pulmonary:     Effort: Pulmonary effort is normal.     Breath sounds: Normal breath sounds.   Musculoskeletal:     Right lower leg: No edema.     Left lower leg: No edema.   Skin:  General: Skin is warm and dry.   Neurological:     Mental Status: She is alert.   Psychiatric:        Mood and Affect: Mood normal.      Musculoskeletal Exam:  Left shoulder stiffness and pain with overhead abduction, no swelling Elbows full ROM no tenderness or swelling Right wrist tenderness to percussion with radiation into fingers Fingers right PIPs with tenderness to pressure and pain with end flexion ROM, no palpable synovitis No paraspinal tenderness to palpation over upper and lower back Hip normal internal and external rotation without pain, no tenderness to lateral hip palpation Knees full ROM no tenderness or swelling   Investigation: No additional findings.  Imaging: No results found.  Recent Labs: Lab Results  Component Value Date   WBC 7.3 01/09/2018   HGB 13.5 01/09/2018   PLT 386 01/09/2018   NA 140 11/12/2013   K 3.5 11/12/2013   CL 101 11/12/2013   CO2 30 11/12/2013   GLUCOSE 140 (H) 11/12/2013   BUN 3 (L) 11/12/2013   CREATININE 0.66 11/12/2013   BILITOT 0.2 (L) 07/02/2012   ALKPHOS 101 07/02/2012   AST 11 07/02/2012   ALT 12 07/02/2012   PROT 8.0 07/02/2012   ALBUMIN 3.3 (L) 07/02/2012   CALCIUM  9.5 11/12/2013    GFRAA >89 11/12/2013    Speciality Comments: No specialty comments available.  Procedures:  No procedures performed Allergies: Tetracyclines & related, Aspirin, Metformin  and related, and Penicillins   Assessment / Plan:     Visit Diagnoses: Positive ANA (antinuclear antibody) - Plan: Sedimentation rate, C-reactive protein Juvenile rheumatoid arthritis with worsening symptoms. Biologic treatments considered for dual benefit with hidradenitis suppurativa. Discussed benefits and risks of biologics. Consider Humira for rheumatoid arthritis and hidradenitis suppurativa. JAK inhibitors also possible but less favored given co morbidities  - Checking ESR and CRP for inflammatory activity - Recheck RA-related antibodies. - Order x-ray of hands.  Poorly controlled type 2 diabetes mellitus with peripheral neuropathy ([facility])  High risk medication use - Plan: CBC with Differential/Platelet, Comprehensive metabolic panel with GFR, QuantiFERON-TB Gold Plus, Hepatitis B core antibody, IgM, Hepatitis B surface antigen - Checking baseline labs for medication monitoring including hepatitis and TB screening  Hidradenitis Suppurativa Chronic hidradenitis suppurativa with recurrent lesions. Biologic treatments like Humira considered for dual benefit with rheumatoid arthritis. Discussed benefits and risks of biologics. - Consider Humira for hidradenitis suppurativa and rheumatoid arthritis. - Provide information on biologic treatments.  Carpal Tunnel Syndrome Symptoms consistent with carpal tunnel syndrome. Discussed inflammation and structural causes. Explained swelling treatment may alleviate symptoms. - Order x-ray of hands. - Provide handout on carpal tunnel exercises. - Recommend wrist brace for nighttime use.  Osteoarthritis Osteoarthritis with joint pain and stiffness. Quadriceps weakness noted. Discussed strengthening exercises to reduce knee strain. - Recommend quadriceps strengthening  exercises, including straight leg raises and wall slides. - Order x-ray of hands.        Orders: Orders Placed This Encounter  Procedures   XR Hand 2 View Right   XR Hand 2 View Left   XR Shoulder Left   Sedimentation rate   C-reactive protein   CBC with Differential/Platelet   Comprehensive metabolic panel with GFR   QuantiFERON-TB Gold Plus   Hepatitis B core antibody, IgM   Hepatitis B surface antigen   Rheumatoid factor   Cyclic citrul peptide antibody, IgG   No orders of the defined types were placed in this encounter.   Follow-Up Instructions: Return in  about 4 weeks (around 10/25/2023) for New pt HS/?RA/CTS f/u 4-6 wks.   Lonni LELON Ester, MD  Note - This record has been created using [employer].  Chart creation errors have been sought, but may not always  have been located. Such creation errors do not reflect on  the standard of medical care.

## 2023-09-27 NOTE — Patient Instructions (Signed)
 Straight leg raises This exercise strengthens the muscles in front of your thigh (quadriceps) and the muscles that move your hips (hip flexors). Lie on your back with your left / right leg extended and your other knee bent. Tense the muscles in the front of your left / right thigh. You should see your kneecap slide up or see increased dimpling just above the knee. Your thigh may even shake a bit. Keep these muscles tight as you raise your leg 4-6 inches (10-15 cm) off the floor. Do not let your knee bend. Hold this position for __________ seconds. Keep these muscles tense as you lower your leg. Relax your muscles slowly and completely after each repetition. Repeat __________ times. Complete this exercise __________ times a day. Hamstring, isometric  Lie on your back on a firm surface. Bend your left / right knee about __________ degrees. Dig your left / right heel into the surface as if you are trying to pull it toward your buttocks. Tighten the muscles in the back of your thighs (hamstring) to "dig" as hard as you can without increasing any pain. Hold this position for __________ seconds. Release the tension gradually and allow your muscles to relax completely for __________ seconds after each repetition. Repeat __________ times. Complete this exercise __________ times a day. Squats This exercise strengthens the muscles in front of your thigh and knee (quadriceps). Stand in front of a table, with your feet and knees pointing straight ahead. You may rest your hands on the table for balance but not for support. Slowly bend your knees and lower your hips like you are going to sit in a chair. Keep your weight over your heels, not over your toes. Keep your lower legs upright so they are parallel with the table legs. Do not let your hips go lower than your knees. Do not bend lower than told by your health care provider. If your knee pain increases, do not bend as low. Hold the squat position for  __________ seconds. Slowly push with your legs to return to standing. Do not use your hands to pull yourself to standing. Repeat __________ times. Complete this exercise __________ times a day. Wall slides This exercise strengthens the muscles in front of your thigh and knee (quadriceps). Lean your back against a smooth wall or door, and walk your feet out 18-24 inches (46-61 cm) from it. Place your feet hip-width apart. Slowly slide down the wall or door until your knees bend __________ degrees. Keep your knees over your heels, not over your toes. Keep your knees in line with your hips. Hold this position for __________ seconds. Repeat __________ times. Complete this exercise __________ times a day. Straight leg raises, side-lying This exercise strengthens the muscles that rotate the leg at the hip and move it away from your body (hip abductors). Lie on your side with your left / right leg in the top position. Lie so your head, shoulder, knee, and hip line up. You may bend your bottom knee to help you keep your balance. Roll your hips slightly forward so your hips are stacked directly over each other and your left / right knee is facing forward. Leading with your heel, lift your top leg 4-6 inches (10-15 cm). You should feel the muscles in your outer hip lifting. Do not let your foot drift forward. Do not let your knee roll toward the ceiling. Hold this position for __________ seconds. Slowly return your leg to the starting position. Let your muscles relax  completely after each repetition. Repeat __________ times. Complete this exercise __________ times a day.

## 2023-09-30 LAB — COMPREHENSIVE METABOLIC PANEL WITH GFR
AG Ratio: 1.1 (calc) (ref 1.0–2.5)
ALT: 10 U/L (ref 6–29)
AST: 11 U/L (ref 10–35)
Albumin: 3.7 g/dL (ref 3.6–5.1)
Alkaline phosphatase (APISO): 73 U/L (ref 37–153)
BUN: 13 mg/dL (ref 7–25)
CO2: 29 mmol/L (ref 20–32)
Calcium: 9.6 mg/dL (ref 8.6–10.4)
Chloride: 103 mmol/L (ref 98–110)
Creat: 0.92 mg/dL (ref 0.50–1.05)
Globulin: 3.3 g/dL (ref 1.9–3.7)
Glucose, Bld: 131 mg/dL — ABNORMAL HIGH (ref 65–99)
Potassium: 4.3 mmol/L (ref 3.5–5.3)
Sodium: 141 mmol/L (ref 135–146)
Total Bilirubin: 0.3 mg/dL (ref 0.2–1.2)
Total Protein: 7 g/dL (ref 6.1–8.1)
eGFR: 71 mL/min/{1.73_m2} (ref >=60)

## 2023-09-30 LAB — CBC WITH DIFFERENTIAL/PLATELET
Absolute Lymphocytes: 1242 {cells}/uL (ref 850–3900)
Absolute Monocytes: 443 {cells}/uL (ref 200–950)
Basophils Absolute: 38 {cells}/uL (ref 0–200)
Basophils Relative: 0.7 %
Eosinophils Absolute: 130 {cells}/uL (ref 15–500)
Eosinophils Relative: 2.4 %
HCT: 42.3 % (ref 35.0–45.0)
Hemoglobin: 13.7 g/dL (ref 11.7–15.5)
MCH: 30 pg (ref 27.0–33.0)
MCHC: 32.4 g/dL (ref 32.0–36.0)
MCV: 92.6 fL (ref 80.0–100.0)
MPV: 10 fL (ref 7.5–12.5)
Monocytes Relative: 8.2 %
Neutro Abs: 3548 {cells}/uL (ref 1500–7800)
Neutrophils Relative %: 65.7 %
Platelets: 335 10*3/uL (ref 140–400)
RBC: 4.57 10*6/uL (ref 3.80–5.10)
RDW: 13.7 % (ref 11.0–15.0)
Total Lymphocyte: 23 %
WBC: 5.4 10*3/uL (ref 3.8–10.8)

## 2023-09-30 LAB — QUANTIFERON-TB GOLD PLUS
Mitogen-NIL: 8.52 [IU]/mL
NIL: 0.01 [IU]/mL
QuantiFERON-TB Gold Plus: NEGATIVE
TB1-NIL: 0.01 [IU]/mL
TB2-NIL: 0.01 [IU]/mL

## 2023-09-30 LAB — RHEUMATOID FACTOR: Rheumatoid fact SerPl-aCnc: 10 [IU]/mL (ref <14)

## 2023-09-30 LAB — SEDIMENTATION RATE: Sed Rate: 43 mm/h — ABNORMAL HIGH (ref 0–30)

## 2023-09-30 LAB — C-REACTIVE PROTEIN: CRP: 61.2 mg/L — ABNORMAL HIGH (ref <8.0)

## 2023-09-30 LAB — CYCLIC CITRUL PEPTIDE ANTIBODY, IGG: Cyclic Citrullin Peptide Ab: <16 U

## 2023-09-30 LAB — HEPATITIS B CORE ANTIBODY, IGM: Hep B C IgM: NONREACTIVE

## 2023-09-30 LAB — HEPATITIS B SURFACE ANTIGEN: Hepatitis B Surface Ag: NONREACTIVE

## 2023-10-29 ENCOUNTER — Ambulatory Visit: Attending: Internal Medicine | Admitting: Internal Medicine

## 2023-10-29 ENCOUNTER — Encounter: Payer: Self-pay | Admitting: Internal Medicine

## 2023-10-29 VITALS — BP 101/64 | HR 85 | Resp 16 | Ht 63.0 in | Wt 202.8 lb

## 2023-10-29 DIAGNOSIS — L732 Hidradenitis suppurativa: Secondary | ICD-10-CM | POA: Insufficient documentation

## 2023-10-29 DIAGNOSIS — M0600 Rheumatoid arthritis without rheumatoid factor, unspecified site: Secondary | ICD-10-CM | POA: Insufficient documentation

## 2023-10-29 DIAGNOSIS — Z79899 Other long term (current) drug therapy: Secondary | ICD-10-CM | POA: Insufficient documentation

## 2023-10-29 MED ORDER — METHOTREXATE SODIUM 2.5 MG PO TABS: 15.0000 mg | ORAL_TABLET | ORAL | 2 refills | Status: DC

## 2023-10-29 MED ORDER — FOLIC ACID 1 MG PO TABS: 1.0000 mg | ORAL_TABLET | Freq: Every day | ORAL | 0 refills | Status: DC

## 2023-10-29 NOTE — Patient Instructions (Signed)
 Methotrexate Tablets What is this medication? METHOTREXATE (METH oh TREX ate) treats autoimmune conditions, such as arthritis and psoriasis. It works by decreasing inflammation, which can reduce pain and prevent long-term injury to the joints and skin. It may also be used to treat some types of cancer. It works by slowing down the growth of cancer cells. This medicine may be used for other purposes; ask your health care provider or pharmacist if you have questions. COMMON BRAND NAME(S): Rheumatrex, Trexall What should I tell my care team before I take this medication? They need to know if you have any of these conditions: Dehydration Diabetes Fluid in the stomach area or lungs Frequently drink alcohol Having surgery, including dental surgery High cholesterol Immune system problems Inflammatory bowel disease, such as ulcerative colitis Kidney disease Liver disease Low blood cell levels (white cells, red cells, and platelets) Lung disease Recent or ongoing radiation Recent or upcoming vaccine Stomach ulcers, other stomach or intestine problems An unusual or allergic reaction to methotrexate, other medications, foods, dyes, or preservatives Pregnant or trying to get pregnant Breastfeeding How should I use this medication? Take this medication by mouth with water. Take it as directed on the prescription label. Do not take extra. Keep taking this medication until your care team tells you to stop. Know why you are taking this medication and how you should take it. To treat conditions such as arthritis and psoriasis, this medication is taken ONCE A WEEK as a single dose or divided into 3 smaller doses taken 12 hours apart (do not take more than 3 doses 12 hours apart each week). This medication is NEVER taken daily to treat conditions other than cancer. Taking this medication more often than directed can cause serious side effects, even death. Talk to your care team about why you are taking this  medication, how often you will take it, and what your dose is. Ask your care team to put the reason you take this medication on the prescription. If you take this medication ONCE A WEEK, choose a day of the week before you start. Ask your pharmacist to include the day of the week on the label. Avoid "Monday", which could be misread as "Morning". Handling this medication may be harmful. Talk to your care team about how to handle this medication. Special instructions may apply. Talk to your care team about the use of this medication in children. While it may be prescribed for selected conditions, precautions do apply. Overdosage: If you think you have taken too much of this medicine contact a poison control center or emergency room at once. NOTE: This medicine is only for you. Do not share this medicine with others. What if I miss a dose? If you miss a dose, talk with your care team. Do not take double or extra doses. What may interact with this medication? Do not take this medication with any of the following: Acitretin Live virus vaccines Probenecid This medication may also interact with the following: Alcohol Aspirin and aspirin-like medications Certain antibiotics, such as penicillin, neomycin, sulfamethoxazole; trimethoprim Certain medications for stomach problems, such as lansoprazole, omeprazole, pantoprazole Clozapine Cyclosporine Dapsone Folic acid F[place]et NSAIDs, medications for pain and inflammation, such as ibuprofen or naproxen Phenytoin Pyrimethamine Steroid medications, such as prednisone or cortisone Tacrolimus Theophylline This list may not describe all possible interactions. Give your health care provider a list of all the medicines, herbs, non-prescription drugs, or dietary supplements you use. Also tell them if you smoke, drink alcohol, or use  illegal drugs. Some items may interact with your medicine. What should I watch for while using this medication? Visit your  care team for regular checks on your progress. It may be some time before you see the benefit from this medication. You may need blood work done while you are taking this medication. If your care team has also prescribed folic acid, they may instruct you to skip your folic acid dose on the day you take methotrexate. This medication can make you more sensitive to the sun. Keep out of the sun. If you cannot avoid being in the sun, wear protective clothing and sunscreen. Do not use sun lamps, tanning beds, or tanning booths. Check with your care team if you have severe diarrhea, nausea, and vomiting, or if you sweat a lot. The loss of too much body fluid may make it dangerous for you to take this medication. This medication may increase your risk of getting an infection. Call your care team for advice if you get a fever, chills, sore throat, or other symptoms of a cold or flu. Do not treat yourself. Try to avoid being around people who are sick. Talk to your care team about your risk of cancer. You may be more at risk for certain types of cancers if you take this medication. Talk to your care team if you or your partner may be pregnant. Serious birth defects can occur if you take this medication during pregnancy and for 6 months after the last dose. You will need a negative pregnancy test before starting this medication. Contraception is recommended while taking this medication and for 6 months after the last dose. Your care team can help you find the option that works for you. If your partner can get pregnant, use a condom during sex while taking this medication and for 3 months after the last dose. Do not breastfeed while taking this medication and for 1 week after the last dose. This medication may cause infertility. Talk to your care team if you are concerned about your fertility. What side effects may I notice from receiving this medication? Side effects that you should report to your care team as soon  as possible: Allergic reactions--skin rash, itching, hives, swelling of the face, lips, tongue, or throat Dry cough, shortness of breath or trouble breathing Infection--fever, chills, cough, sore throat, wounds that don't heal, pain or trouble when passing urine, general feeling of discomfort or being unwell Kidney injury--decrease in the amount of urine, swelling of the ankles, hands, or feet Liver injury--right upper belly pain, loss of appetite, nausea, light-colored stool, dark yellow or brown urine, yellowing skin or eyes, unusual weakness or fatigue Low red blood cell level--unusual weakness or fatigue, dizziness, headache, trouble breathing Pain, tingling, or numbness in the hands or feet, muscle weakness, change in vision, confusion or trouble speaking, loss of balance or coordination, trouble walking, seizures Redness, blistering, peeling, or loosening of the skin, including inside the mouth Stomach bleeding--bloody or black, tar-like stools, vomiting blood or brown material that looks like coffee grounds Stomach pain that is severe, does not go away, or gets worse Unusual bruising or bleeding Side effects that usually do not require medical attention (report these to your care team if they continue or are bothersome): Diarrhea Dizziness Hair loss Nausea Pain, redness, or swelling with sores inside the mouth or throat Skin reactions on sun-exposed areas Vomiting This list may not describe all possible side effects. Call your doctor for medical advice about side effects.  You may report side effects to FDA at 1-800-FDA-1088. Where should I keep my medication? Keep out of the reach of children and pets. Store at room temperature between 20 and 25 degrees C (68 and 77 degrees F). Protect from light. Keep the container tightly closed. Get rid of any unused medication after the expiration date. To get rid of medications that are no longer needed or have expired: Take the medication to a  medication take-back program. Check with your pharmacy or law enforcement to find a location. If you cannot return the medication, ask your pharmacist or care team how to get rid of this medication safely. NOTE: This sheet is a summary. It may not cover all possible information. If you have questions about this medicine, talk to your doctor, pharmacist, or health care provider.  2024 [employer]vier/Gold Standard (2023-03-01 00:00:00)

## 2023-10-29 NOTE — Progress Notes (Signed)
 Office Visit Note  Patient: Melanie Henry             Date of Birth: Aug 08, 1960           MRN: [id number]             PCP: Sebastian Jerilyn HERO, FNP Referring: Sebastian Jerilyn HERO, * Visit Date: 10/29/2023   Subjective:  Follow-up (Back pain .)   Discussed the use of AI scribe software for clinical note transcription with the patient, who gave verbal consent to proceed.  History of Present Illness   Melanie Henry is a 63 year old female who presents with inflammatory arthritis and hidradenitis suppurativa. Labs at our initial visit demonstrated very high inflammatory markers especially CRP but negative for RA antibodies.   She experiences significant joint pain, particularly in her lower back, which was exacerbated last week after performing leg and thigh exercises. The pain is described as muscular and located in the lower back, which has since eased. She attributes the onset of pain to possibly performing the exercises incorrectly or on an unsuitable surface.  She experiences symptoms of hidradenitis suppurativa, with drainage occurring under her breast and in the groin area. She notes that the drainage has decreased recently. No current oral antibiotic use is reported.  Symptoms of carpal tunnel syndrome include burning and numbness in her hands upon waking and reaching for objects. She uses a brace at night, which has slowed the progression of symptoms. The symptoms are less severe upon waking but develop as she starts using her hands.  She reports feeling extremely fatigued, sleeping 10-12 hours a day, yet still feeling exhausted with zero energy. She describes it as 'almost like there's a button somewhere' that cuts off her energy.   Previous HPI 09/27/23 Melanie Henry is a 63 year old female with hidradenitis suppurativa here for evaluation with joint pain in multiple areas and elevated serum inflammatory markers.   She has experienced increased stiffness and swelling in her hands  over the past two years, significantly impacting her daily activities. She has difficulty gripping objects, such as a frying pan, and her hands sometimes become weak and do not close fully. Numbness and a burning sensation in her knuckles occur, particularly after activities like crocheting, which she can only do for about ten minutes before her hand goes numb.   Her knees and ankles have also been more symptomatic over the last year, with the right ankle recently becoming stiff and painful. While her knees do not swell significantly, they do experience crunching and popping sounds. 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 Currently, she takes Tylenol, two tablets twice or sometimes three times a day, to manage her symptoms.   She has been dealing with hidradenitis suppurativa since she was 57, although she only learned the name of the condition about 8 to 10 years ago. She has not seen a dermatologist but has consulted a surgeon regarding potential surgery for severe spots. She has been treated with antibiotics, typically clindamycin , for infections that occur periodically, with one area draining steadily for about six to seven years.   She also reports issues with her shoulder, which sometimes gets stuck and was previously treated with physical therapy. She describes a crackling sensation when rotating the shoulder. During the review of symptoms, she mentions experiencing tingling and numbness in her hands, particularly when flexing or engaging  her hands for extended periods.       Labs reviewed 01/2023 ANA 1:80 homogenous ESR 58 CRP 30.7   12/2017 HCV neg   Review of Systems  Constitutional:  Positive for fatigue.  HENT:  Negative for mouth sores and mouth dryness.   Eyes:  Negative for dryness.  Respiratory:  Negative for shortness of breath.   Cardiovascular:   Negative for chest pain and palpitations.  Gastrointestinal:  Negative for blood in stool, constipation and diarrhea.  Endocrine: Negative for increased urination.  Genitourinary:  Negative for involuntary urination.  Musculoskeletal:  Positive for joint pain, gait problem, joint pain, joint swelling, myalgias, morning stiffness, muscle tenderness and myalgias. Negative for muscle weakness.  Skin:  Negative for color change, rash, hair loss and sensitivity to sunlight.  Allergic/Immunologic: Positive for susceptible to infections.  Neurological:  Negative for dizziness and headaches.  Hematological:  Negative for swollen glands.  Psychiatric/Behavioral:  Negative for sleep disturbance. The patient is not nervous/anxious.     PMFS History:  Patient Active Problem List   Diagnosis Date Noted   Seronegative rheumatoid arthritis (HCC) 10/29/2023   Carpal tunnel syndrome, right upper limb 09/27/2023   High risk medication use 09/27/2023   Fatigue 01/12/2018   Pain due to dental caries 07/22/2015   Hypertension 06/29/2014   Shoulder pain, left 02/18/2014   Depression 02/18/2014   HLD (hyperlipidemia) 11/19/2013   Other malaise and fatigue 11/12/2013   Hidradenitis suppurativa 12/18/2012   Health care maintenance 07/08/2012   Abnormality of gait 07/08/2012   Poorly controlled type 2 diabetes mellitus with peripheral neuropathy (HCC) 07/03/2012   Morbid obesity (HCC) 07/03/2012   Tobacco abuse 07/03/2012    Past Medical History:  Diagnosis Date   Allergy    Daily headache    for the past 2-3 wks (07/02/2012)   Depression    Dysrhythmia    Eclampsia 04/02/1986   stroke like symtoms with pregnancy, h/o protenuria with eclampsia    Heart murmur 04/02/1986   I had eclampsia; they said I had a stroke (07/02/2012)   Hidradenitis suppurativa    History of juvenile arthritis    diagnosed age 42 y.o; affects hands, knees    Hypertension    Hypothyroid    Kidney stones    once  (07/02/2012)   Migraines    occasionally (07/02/2012)   Obesity    Peripheral neuropathy    As of 07/2012 patient reports she was dx'ed 25 yrs ago previously taking Neurontin and B12 shot    Pneumonia ~ 1991   once (07/02/2012)   Tachycardia     Family History  Problem Relation Age of Onset   Cancer Mother        breast cancer x4   Osteoarthritis Mother    Heart failure Mother    Diabetes Father    Liver disease Father    AAA (abdominal aortic aneurysm) Father    Healthy Sister    Glaucoma Maternal Grandmother    Diabetes Paternal Uncle    Past Surgical History:  Procedure Laterality Date   ANKLE FUSION Right 12/02/1978   pinned (07/02/2012)   CESAREAN SECTION  1988; 1990   DENTAL SURGERY  2004   THUMB FUSION Right 12/01/1968   pinned; reattached tendon (07/02/2012)   TONSILLECTOMY  04/02/1978   TOOTH EXTRACTION  2021   x2   TUBAL LIGATION  03/1989; 06/1990   x2   Social History   Social History Narrative   2 kids   Smokes  4-5 cigarettes daily.  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   Objective: Vital Signs: BP 101/64 (BP Location: Left Arm, Patient Position: Sitting, Cuff Size: Normal)   Pulse 85   Resp 16   Ht 5' 3 (1.6 m)   Wt 202 lb 12.8 oz (92 kg)   BMI 35.92 kg/m    Physical Exam Constitutional:      Appearance: She is obese.  Eyes:     Conjunctiva/sclera: Conjunctivae normal.  Cardiovascular:     Rate and Rhythm: Normal rate and regular rhythm.  Pulmonary:     Effort: Pulmonary effort is normal.     Breath sounds: Normal breath sounds.  Musculoskeletal:     Right lower leg: No edema.     Left lower leg: No edema.  Lymphadenopathy:     Cervical: No cervical adenopathy.  Neurological:     Mental Status: She is alert.  Psychiatric:        Mood and Affect: Mood normal.      Musculoskeletal Exam:  Left shoulder stiffness and pain with  overhead abduction, no swelling or focal tenderness to pressure Elbows full ROM no tenderness or swelling Right wrist tenderness to percussion with radiation into fingers Fingers right PIPs with tenderness to pressure and pain with end flexion ROM, no palpable synovitis No paraspinal tenderness to palpation over upper and lower back Hip normal internal and external rotation without pain, no tenderness to lateral hip palpation Knees full ROM no tenderness or swelling  Investigation: No additional findings.  Imaging: No results found.  Recent Labs: Lab Results  Component Value Date   WBC 5.4 09/27/2023   HGB 13.7 09/27/2023   PLT 335 09/27/2023   NA 141 09/27/2023   K 4.3 09/27/2023   CL 103 09/27/2023   CO2 29 09/27/2023   GLUCOSE 131 (H) 09/27/2023   BUN 13 09/27/2023   CREATININE 0.92 09/27/2023   BILITOT 0.3 09/27/2023   ALKPHOS 101 07/02/2012   AST 11 09/27/2023   ALT 10 09/27/2023   PROT 7.0 09/27/2023   ALBUMIN 3.3 (L) 07/02/2012   CALCIUM  9.6 09/27/2023   GFRAA >89 11/12/2013   QFTBGOLDPLUS NEGATIVE 09/27/2023    Speciality Comments: No specialty comments available.  Procedures:  No procedures performed Allergies: Tetracyclines & related, Aspirin, Metformin  and related, and Penicillins   Assessment / Plan:     Visit Diagnoses: Seronegative rheumatoid arthritis (HCC) - Plan: methotrexate  (RHEUMATREX) 2.5 MG tablet, folic acid  (FOLVITE ) 1 MG tablet Seronegative inflammatory arthritis with elevated ESR and CRP, negative RA markers, no joint erosion. Joint pain and stiffness suggest inflammation. She is hesitant about biologics due to hidradenitis suppurativa concerns where she saw some patients will experience worsening instead. Methotrexate  is a suitable alternative. - Initiate methotrexate  15 mg PO weekly. - Prescribe folic acid  1 mg daily.  Hidradenitis suppurativa  High risk medication use - Plan: CBC with Differential/Platelet, Comprehensive metabolic panel  with GFR Recent labs normal and no preexisting risk factors for starting methotrexate . Reviewed risks including cytopenias, hepatotoxocity, GI intolerance, hair or nail changes. She is more concerned about infectious or malignancy side effects and does not want to start any biologics for this reason. - Future repeat labs for 1 month CBC and CMP  Left shoulder osteoarthritis Osteoarthritis in the left shoulder with bone spurring and impingement. No erosive disease noted.  Carpal tunnel syndrome, right wrist Carpal tunnel syndrome in the right wrist with burning and numbness, improved with night brace. Potential candidate for steroid injection if symptoms persist. - Continue using wrist brace at night. - Reassess carpal tunnel symptoms after starting methotrexate .  Low back pain, likely muscular Acute low back pain likely muscular, exacerbated by recent exercises. Symptoms have eased. - Advise on proper exercise techniques to avoid strain. - Recommend stretching exercises to strengthen and loosen back muscles.   Orders: Orders Placed This Encounter  Procedures   CBC with Differential/Platelet   Comprehensive metabolic panel with GFR   Meds ordered this encounter  Medications   methotrexate  (RHEUMATREX) 2.5 MG tablet    Sig: Take 6 tablets (15 mg total) by mouth once a week. Caution:Chemotherapy. Protect from light.    Dispense:  30 tablet    Refill:  2   folic acid  (FOLVITE ) 1 MG tablet    Sig: Take 1 tablet (1 mg total) by mouth daily.    Dispense:  90 tablet    Refill:  0     Follow-Up Instructions: Return in about 3 months (around 01/29/2024) for RA/HS MX start f/u 3mos.   Lonni LELON Ester, MD  Note - This record has been created using [employer].  Chart creation errors have been sought, but may not always  have been located. Such creation errors do not reflect on  the standard of medical care.

## 2023-11-19 ENCOUNTER — Ambulatory Visit: Admitting: Podiatry

## 2023-12-03 ENCOUNTER — Ambulatory Visit (INDEPENDENT_AMBULATORY_CARE_PROVIDER_SITE_OTHER): Admitting: Podiatry

## 2023-12-03 ENCOUNTER — Encounter: Payer: Self-pay | Admitting: Podiatry

## 2023-12-03 VITALS — Ht 63.0 in | Wt 202.0 lb

## 2023-12-03 DIAGNOSIS — M79671 Pain in right foot: Secondary | ICD-10-CM | POA: Diagnosis not present

## 2023-12-03 DIAGNOSIS — M79672 Pain in left foot: Secondary | ICD-10-CM | POA: Diagnosis not present

## 2023-12-03 DIAGNOSIS — B351 Tinea unguium: Secondary | ICD-10-CM

## 2023-12-03 NOTE — Progress Notes (Signed)
 Patient presents for evaluation and treatment of tenderness and some redness around nails feet.  Tenderness around toes with walking and wearing shoes.  Physical exam:  General appearance: Alert, pleasant, and in no acute distress.  Vascular: Pedal pulses: DP 2/4 B/L, PT 0/4 B/L. Mild edema lower legs bilaterally  Neu  Dermatologic:  Nails thickened, disfigured, discolored 1-5 BL with subungual debris.  Redness and hypertrophic nail folds along nail folds bilaterally but no signs of drainage or infection.  Skin thin atrophic lower extremity bilaterally.  No hair growth lower extremity bilaterally  Musculoskeletal:     Diagnosis: 1. Painful onychomycotic nails 1 through 5 bilaterally. 2. Pain toes 1 through 5 bilaterally.  Plan: -Debrided onychomycotic nails 1 through 5 bilaterally.  Sharply debrided nails with nail clipper and reduced with a power bur.  Return 3 months [facility]

## 2024-01-20 NOTE — Progress Notes (Signed)
 Office Visit Note  Patient: Melanie Henry             Date of Birth: 1960/11/27           MRN: [id number]             PCP: Sebastian Jerilyn HERO, FNP Referring: Sebastian Jerilyn HERO, * Visit Date: 01/30/2024   Subjective:  Medication Management (Patient could not take the methotrexate  had her sick, vomiting and causing severe stomach cramps. Would like to discuss other medication options )   Discussed the use of AI scribe software for clinical note transcription with the patient, who gave verbal consent to proceed.  History of Present Illness   Melanie Henry is a 63 year old female with juvenile idiopathic arthritis and hidradenitis suppurativa who presents with persistent joint pain and skin rashes.  We previously tried starting methotrexate  which she tolerated very poorly with severe GI upset and diarrhea so is off of the medication.  She was not able to take it long enough to see whether there was any improvement or not for her arthritis or rashes.  She experiences persistent joint pain and stiffness, which have remained stable since her last visit. Her history of juvenile idiopathic arthritis is notable, and she lacks antibody markers for rheumatoid arthritis. Despite consistent joint symptoms, there have been no significant changes in her condition. She suspects that inflammation contributes to her pain and stiffness.  She deals with ongoing skin rashes related to hidradenitis suppurativa. Her primary care physician mentioned Cosentyx, a biologic approved for hidradenitis suppurativa, but she has not started it yet.  Her history includes high inflammation markers from previous blood tests. She has a family history of tuberculosis, as her mother had it when she was a baby. However, her QuantiFERON TB Gold test in June was negative, despite positive skin tests in the past.  She is experienced with self-administered injections, having previously been on daily insulin  and currently using  Ozempic once a week.  Previous HPI 10/29/2023 Melanie Henry is a 63 year old female who presents with inflammatory arthritis and hidradenitis suppurativa. Labs at our initial visit demonstrated very high inflammatory markers especially CRP but negative for RA antibodies.    She experiences significant joint pain, particularly in her lower back, which was exacerbated last week after performing leg and thigh exercises. The pain is described as muscular and located in the lower back, which has since eased. She attributes the onset of pain to possibly performing the exercises incorrectly or on an unsuitable surface.   She experiences symptoms of hidradenitis suppurativa, with drainage occurring under her breast and in the groin area. She notes that the drainage has decreased recently. No current oral antibiotic use is reported.   Symptoms of carpal tunnel syndrome include burning and numbness in her hands upon waking and reaching for objects. She uses a brace at night, which has slowed the progression of symptoms. The symptoms are less severe upon waking but develop as she starts using her hands.   She reports feeling extremely fatigued, sleeping 10-12 hours a day, yet still feeling exhausted with zero energy. She describes it as 'almost like there's a button somewhere' that cuts off her energy.     Previous HPI 09/27/23 Melanie Henry is a 63 year old female with hidradenitis suppurativa here for evaluation with joint pain in multiple areas and elevated serum inflammatory markers.   She has experienced increased stiffness and swelling in her hands over the past two years,  significantly impacting her daily activities. She has difficulty gripping objects, such as a frying pan, and her hands sometimes become weak and do not close fully. Numbness and a burning sensation in her knuckles occur, particularly after activities like crocheting, which she can only do for about ten minutes before her hand goes numb.    Her knees and ankles have also been more symptomatic over the last year, with the right ankle recently becoming stiff and painful. While her knees do not swell significantly, they do experience crunching and popping sounds. She has a history of juvenile rheumatoid arthritis, diagnosed at age 30, and was previously treated with daily aspirin, which led to overdose levels and hallucinations. Currently, she takes Tylenol, two tablets twice or sometimes three times a day, to manage her symptoms.   She has been dealing with hidradenitis suppurativa since she was 5, although she only learned the name of the condition about 8 to 10 years ago. She has not seen a dermatologist but has consulted a surgeon regarding potential surgery for severe spots. She has been treated with antibiotics, typically clindamycin , for infections that occur periodically, with one area draining steadily for about six to seven years.   She also reports issues with her shoulder, which sometimes gets stuck and was previously treated with physical therapy. She describes a crackling sensation when rotating the shoulder. During the review of symptoms, she mentions experiencing tingling and numbness in her hands, particularly when flexing or engaging her hands for extended periods.       Labs reviewed 01/2023 ANA 1:80 homogenous ESR 58 CRP 30.7   12/2017 HCV neg     Review of Systems  Constitutional:  Positive for fatigue.  HENT:  Negative for mouth sores and mouth dryness.   Eyes:  Negative for dryness.  Respiratory:  Negative for shortness of breath.   Cardiovascular:  Negative for chest pain and palpitations.  Gastrointestinal:  Positive for constipation. Negative for blood in stool and diarrhea.  Endocrine: Negative for increased urination.  Genitourinary:  Negative for involuntary urination.  Musculoskeletal:  Positive for joint pain, gait problem, joint pain, joint swelling, muscle weakness and morning stiffness.  Negative for myalgias, muscle tenderness and myalgias.  Skin:  Negative for color change, rash, hair loss and sensitivity to sunlight.  Allergic/Immunologic: Negative for susceptible to infections.  Neurological:  Negative for dizziness and headaches.  Hematological:  Negative for swollen glands.  Psychiatric/Behavioral:  Positive for depressed mood and sleep disturbance. The patient is nervous/anxious.     PMFS History:  Patient Active Problem List   Diagnosis Date Noted   Seronegative rheumatoid arthritis (HCC) 10/29/2023   Carpal tunnel syndrome, right upper limb 09/27/2023   High risk medication use 09/27/2023   Fatigue 01/12/2018   Pain due to dental caries 07/22/2015   Hypertension 06/29/2014   Shoulder pain, left 02/18/2014   Depression 02/18/2014   HLD (hyperlipidemia) 11/19/2013   Other malaise and fatigue 11/12/2013   Hidradenitis suppurativa 12/18/2012   Health care maintenance 07/08/2012   Abnormality of gait 07/08/2012   Poorly controlled type 2 diabetes mellitus with peripheral neuropathy (HCC) 07/03/2012   Morbid obesity (HCC) 07/03/2012   Tobacco abuse 07/03/2012    Past Medical History:  Diagnosis Date   Allergy    Daily headache    for the past 2-3 wks (07/02/2012)   Depression    Dysrhythmia    Eclampsia 04/02/1986   stroke like symtoms with pregnancy, h/o protenuria with eclampsia    Heart  murmur 04/02/1986   I had eclampsia; they said I had a stroke (07/02/2012)   Hidradenitis suppurativa    History of juvenile arthritis    diagnosed age 11 y.o; affects hands, knees    Hypertension    Hypothyroid    Kidney stones    once (07/02/2012)   Migraines    occasionally (07/02/2012)   Obesity    Peripheral neuropathy    As of 07/2012 patient reports she was dx'ed 25 yrs ago previously taking Neurontin and B12 shot    Pneumonia ~ 1991   once (07/02/2012)   Tachycardia     Family History  Problem Relation Age of Onset   Cancer Mother        breast  cancer x4   Osteoarthritis Mother    Heart failure Mother    Diabetes Father    Liver disease Father    AAA (abdominal aortic aneurysm) Father    Healthy Sister    Glaucoma Maternal Grandmother    Diabetes Paternal Uncle    Past Surgical History:  Procedure Laterality Date   ANKLE FUSION Right 12/02/1978   pinned (07/02/2012)   CESAREAN SECTION  1988; 1990   DENTAL SURGERY  2004   THUMB FUSION Right 12/01/1968   pinned; reattached tendon (07/02/2012)   TONSILLECTOMY  04/02/1978   TOOTH EXTRACTION  2021   x2   TUBAL LIGATION  03/1989; 06/1990   x2   Social History   Social History Narrative   2 kids   Smokes 4-5 cigarettes daily.  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   Objective: Vital Signs: BP (!) 147/78   Pulse 79   Temp (!) 96.7 F (35.9 C)   Resp 16   Ht 5' 3 (1.6 m)   Wt 197 lb 3.2 oz (89.4 kg)   BMI 34.93 kg/m    Physical Exam Constitutional:      Appearance: She is obese.  Eyes:     Conjunctiva/sclera: Conjunctivae normal.  Cardiovascular:     Rate and Rhythm: Normal rate and regular rhythm.  Pulmonary:     Effort: Pulmonary effort is normal.     Breath sounds: Normal breath sounds.  Musculoskeletal:     Right lower leg: No edema.     Left lower leg: No edema.  Lymphadenopathy:     Cervical: No cervical adenopathy.  Skin:    General: Skin is warm and dry.  Neurological:     Mental Status: She is alert.  Psychiatric:        Mood and Affect: Mood normal.      Musculoskeletal Exam:  Left shoulder stiffness and pain with overhead abduction Elbows full ROM no tenderness or swelling Right wrist tenderness with flexion and to percussion, no palpable swelling Fingers right PIPs with tenderness to pressure and pain with end flexion ROM, no palpable synovitis No paraspinal tenderness to palpation over upper and lower back Hip normal internal  and external rotation without pain, no tenderness to lateral hip palpation Knees full ROM no tenderness or swelling  Investigation: No additional findings.  Imaging: No results found.  Recent Labs: Lab Results  Component Value Date   WBC 3.7 (L) 01/30/2024   HGB 15.1 01/30/2024   PLT 313 01/30/2024   NA 139 01/30/2024   K 4.1 01/30/2024   CL 103 01/30/2024   CO2  29 01/30/2024   GLUCOSE 97 01/30/2024   BUN 8 01/30/2024   CREATININE 0.87 01/30/2024   BILITOT 0.4 01/30/2024   ALKPHOS 101 07/02/2012   AST 11 01/30/2024   ALT 8 01/30/2024   PROT 7.0 01/30/2024   ALBUMIN 3.3 (L) 07/02/2012   CALCIUM  9.2 01/30/2024   GFRAA >89 11/12/2013   QFTBGOLDPLUS NEGATIVE 09/27/2023    Speciality Comments: No specialty comments available.  Procedures:  No procedures performed Allergies: Tetracyclines & related, Aspirin, Metformin  and related, Penicillins, and Methotrexate  and trimetrexate   Assessment / Plan:     Visit Diagnoses: Seronegative rheumatoid arthritis (HCC)  Inflammatory arthritis with Juvenile Idiopathic Arthritis (JIA) Inflammatory arthritis persists with elevated inflammation markers. Humira preferred for JIA-type inflammation and expected to improve joint pain, stiffness, and skin disease. - Initiate Humira (adalimumab) 40 mg subcutaneously every two weeks. - Recheck CRP previously elevated inflammatory marker prior to Humira.  Hidradenitis suppurativa - Plan: C-reactive protein  High risk medication use - Plan: CBC with Differential/Platelet, Comprehensive metabolic panel with GFR Reviewed risk of Humira including injection site reactions, infections, cytopenias, hepatotoxicity, long-term risks for lymphoma or nonmelanoma skin cancer.  She does not have any pre-existing condition that would contraindicate treatment.  Reviewed previous negative QuantiFERON screening. -Checking CBC CMP baseline for medication monitoring before starting Humira  Hidradenitis  suppurativa Humira preferred for efficacy in hidradenitis suppurativa and JIA, expected to improve skin disease. - Initiate Humira treatment. - Monitor skin condition for improvement.  Vaccination considerations with Humira She had questions about her regular vaccination series with immunosuppressive medication.  Humira does not affect non-live vaccines; live vaccines should be avoided but she does not need any at this time.   Orders: Orders Placed This Encounter  Procedures   C-reactive protein   CBC with Differential/Platelet   Comprehensive metabolic panel with GFR   No orders of the defined types were placed in this encounter.    Follow-Up Instructions: Return in about 3 months (around 05/01/2024) for RA/HS ADA start f/u 3mos.   Lonni LELON Ester, MD  Note - This record has been created using [employer].  Chart creation errors have been sought, but may not always  have been located. Such creation errors do not reflect on  the standard of medical care.

## 2024-01-30 ENCOUNTER — Encounter: Payer: Self-pay | Admitting: Internal Medicine

## 2024-01-30 ENCOUNTER — Ambulatory Visit: Attending: Internal Medicine | Admitting: Internal Medicine

## 2024-01-30 VITALS — BP 147/78 | HR 79 | Temp 96.7°F | Resp 16 | Ht 63.0 in | Wt 197.2 lb

## 2024-01-30 DIAGNOSIS — M0600 Rheumatoid arthritis without rheumatoid factor, unspecified site: Secondary | ICD-10-CM | POA: Insufficient documentation

## 2024-01-30 DIAGNOSIS — L732 Hidradenitis suppurativa: Secondary | ICD-10-CM | POA: Insufficient documentation

## 2024-01-30 DIAGNOSIS — Z79899 Other long term (current) drug therapy: Secondary | ICD-10-CM | POA: Diagnosis not present

## 2024-01-31 LAB — COMPREHENSIVE METABOLIC PANEL WITH GFR
AG Ratio: 1.2 (calc) (ref 1.0–2.5)
ALT: 8 U/L (ref 6–29)
AST: 11 U/L (ref 10–35)
Albumin: 3.8 g/dL (ref 3.6–5.1)
Alkaline phosphatase (APISO): 86 U/L (ref 37–153)
BUN: 8 mg/dL (ref 7–25)
CO2: 29 mmol/L (ref 20–32)
Calcium: 9.2 mg/dL (ref 8.6–10.4)
Chloride: 103 mmol/L (ref 98–110)
Creat: 0.87 mg/dL (ref 0.50–1.05)
Globulin: 3.2 g/dL (ref 1.9–3.7)
Glucose, Bld: 97 mg/dL (ref 65–99)
Potassium: 4.1 mmol/L (ref 3.5–5.3)
Sodium: 139 mmol/L (ref 135–146)
Total Bilirubin: 0.4 mg/dL (ref 0.2–1.2)
Total Protein: 7 g/dL (ref 6.1–8.1)
eGFR: 76 mL/min/1.73m2 (ref >=60)

## 2024-01-31 LAB — CBC WITH DIFFERENTIAL/PLATELET
Absolute Lymphocytes: 1084 {cells}/uL (ref 850–3900)
Absolute Monocytes: 366 {cells}/uL (ref 200–950)
Basophils Absolute: 30 {cells}/uL (ref 0–200)
Basophils Relative: 0.8 %
Eosinophils Absolute: 81 {cells}/uL (ref 15–500)
Eosinophils Relative: 2.2 %
HCT: 44.9 % (ref 35.0–45.0)
Hemoglobin: 15.1 g/dL (ref 11.7–15.5)
MCH: 31.6 pg (ref 27.0–33.0)
MCHC: 33.6 g/dL (ref 32.0–36.0)
MCV: 93.9 fL (ref 80.0–100.0)
MPV: 10.3 fL (ref 7.5–12.5)
Monocytes Relative: 9.9 %
Neutro Abs: 2139 {cells}/uL (ref 1500–7800)
Neutrophils Relative %: 57.8 %
Platelets: 313 Thousand/uL (ref 140–400)
RBC: 4.78 Million/uL (ref 3.80–5.10)
RDW: 13.7 % (ref 11.0–15.0)
Total Lymphocyte: 29.3 %
WBC: 3.7 Thousand/uL — ABNORMAL LOW (ref 3.8–10.8)

## 2024-01-31 LAB — C-REACTIVE PROTEIN: CRP: 18.1 mg/L — ABNORMAL HIGH (ref <8.0)

## 2024-02-03 ENCOUNTER — Telehealth: Payer: Self-pay

## 2024-02-03 DIAGNOSIS — Z79899 Other long term (current) drug therapy: Secondary | ICD-10-CM

## 2024-02-03 DIAGNOSIS — M0600 Rheumatoid arthritis without rheumatoid factor, unspecified site: Secondary | ICD-10-CM

## 2024-02-03 DIAGNOSIS — L732 Hidradenitis suppurativa: Secondary | ICD-10-CM

## 2024-02-03 NOTE — Telephone Encounter (Signed)
 From: Jeannetta Lonni ORN, MD  Sent: 02/01/2024   3:26 PM EST  To: Sherry GORMAN Pennant, RPH-CPP   Please start BiV for Melanie Henry for adalimumab for seronegative rheumatoid arthritis (JIA) and hidradenitis suppurativa after methotrexate  intolerance. -----------------------------------------------------------------------------------------------------------------  Submitted a Prior Authorization request to [employer] for HUMIRA via CoverMyMeds. Will update once we receive a response.  Key: [id number]

## 2024-02-07 NOTE — Telephone Encounter (Signed)
 Received a fax regarding Prior Authorization from [employer] for HUMIRA. Authorization has been DENIED because .  Will need to submit hepatitis results with competed appeal request form on page 4 of denial letter (allowing MDO to submit appeal for patient)  Sherry Pennant, PharmD, MPH, BCPS, CPP Clinical Pharmacist [facility])

## 2024-02-11 NOTE — Telephone Encounter (Signed)
 Submitted an URGENT appeal to [employer] for HUMIRA.  Reference # [id number] Phone: [phone] Fax: [phone]  Sherry Pennant, PharmD, MPH, BCPS, CPP Clinical Pharmacist [facility])

## 2024-02-12 ENCOUNTER — Other Ambulatory Visit (HOSPITAL_COMMUNITY): Payer: Self-pay

## 2024-02-12 MED ORDER — HUMIRA (2 PEN) 40 MG/0.4ML ~~LOC~~ AJKT
40.0000 mg | AUTO-INJECTOR | SUBCUTANEOUS | 0 refills | Status: DC
  Filled 2024-02-14: qty 2, 28d supply, fill #0

## 2024-02-12 MED ORDER — HUMIRA (2 PEN) 40 MG/0.4ML ~~LOC~~ AJKT: 40.0000 mg | AUTO-INJECTOR | SUBCUTANEOUS | 0 refills | Status: DC

## 2024-02-12 NOTE — Telephone Encounter (Signed)
 Received notification from [facility] MEDICAID regarding a prior authorization for HUMIRA. Authorization has been APPROVED from 02/11/2024 to 02/10/2025. Approval letter sent to scan center.  Per test claim, copay for 28 days supply is $4  Patient can fill through [facility]: [phone]   Phone # [phone]   Patient can be scheduled for Humira new start. Rx sent to [facility] to be couriered.  Sherry Pennant, PharmD, MPH, BCPS, CPP Clinical Pharmacist [facility])

## 2024-02-12 NOTE — Telephone Encounter (Signed)
 Contacted patient to schedule Humira new start appointment. New start appointment scheduled for 02/19/24. Patient denies any antibiotic use, current infection, and upcoming procedures.  Jenkins Graces, PharmD PGY1 Pharmacy Resident [phone]

## 2024-02-14 ENCOUNTER — Other Ambulatory Visit: Payer: Self-pay

## 2024-02-14 ENCOUNTER — Other Ambulatory Visit (HOSPITAL_COMMUNITY): Payer: Self-pay

## 2024-02-14 NOTE — Progress Notes (Signed)
 Specialty Pharmacy Initial Fill Coordination Note  Keniah Klemmer is a 63 y.o. female contacted today regarding initial fill of specialty medication(s) Adalimumab (Humira (2 Pen))   Patient requested Courier to Provider Office   Delivery date: 02/18/24   Verified address: [address] [place], [place] [address]   Medication will be filled on: 02/17/24   Patient is aware of $4 copayment and wishes to have it billed to AR account. Advised pt that she could provide card information at any point in the future, she verbalized understanding.

## 2024-02-17 ENCOUNTER — Other Ambulatory Visit: Payer: Self-pay

## 2024-02-18 ENCOUNTER — Other Ambulatory Visit (HOSPITAL_COMMUNITY): Payer: Self-pay

## 2024-02-19 ENCOUNTER — Other Ambulatory Visit: Payer: Self-pay

## 2024-02-19 ENCOUNTER — Ambulatory Visit: Attending: Internal Medicine | Admitting: Pharmacist

## 2024-02-19 DIAGNOSIS — Z79899 Other long term (current) drug therapy: Secondary | ICD-10-CM | POA: Diagnosis present

## 2024-02-19 DIAGNOSIS — Z7189 Other specified counseling: Secondary | ICD-10-CM | POA: Diagnosis present

## 2024-02-19 DIAGNOSIS — M0600 Rheumatoid arthritis without rheumatoid factor, unspecified site: Secondary | ICD-10-CM | POA: Insufficient documentation

## 2024-02-19 DIAGNOSIS — L732 Hidradenitis suppurativa: Secondary | ICD-10-CM | POA: Diagnosis present

## 2024-02-19 MED ORDER — HUMIRA (2 PEN) 40 MG/0.4ML ~~LOC~~ AJKT
40.0000 mg | AUTO-INJECTOR | SUBCUTANEOUS | 1 refills | Status: AC
  Filled 2024-02-19 – 2024-03-18 (×4): qty 2, 28d supply, fill #0
  Filled 2024-04-13: qty 2, 28d supply, fill #1

## 2024-02-19 NOTE — Progress Notes (Signed)
 Pharmacy Note  Subjective:   Patient presents to clinic today to receive first dose of HUMIRA for rheumatoid arthritis. Patient discontinued methotrexate  15 mg weekly.  Patient running a fever or have signs/symptoms of infection? No  Patient currently on antibiotics for the treatment of infection? No  Patient have any upcoming invasive procedures/surgeries? No  Objective: CMP     Component Value Date/Time   NA 139 01/30/2024 1146   K 4.1 01/30/2024 1146   CL 103 01/30/2024 1146   CO2 29 01/30/2024 1146   GLUCOSE 97 01/30/2024 1146   BUN 8 01/30/2024 1146   CREATININE 0.87 01/30/2024 1146   CALCIUM  9.2 01/30/2024 1146   PROT 7.0 01/30/2024 1146   ALBUMIN 3.3 (L) 07/02/2012 1726   AST 11 01/30/2024 1146   ALT 8 01/30/2024 1146   ALKPHOS 101 07/02/2012 1726   BILITOT 0.4 01/30/2024 1146   GFRNONAA >89 11/12/2013 1645   GFRAA >89 11/12/2013 1645    CBC    Component Value Date/Time   WBC 3.7 (L) 01/30/2024 1146   RBC 4.78 01/30/2024 1146   HGB 15.1 01/30/2024 1146   HGB 13.5 01/09/2018 1531   HCT 44.9 01/30/2024 1146   HCT 40.1 01/09/2018 1531   PLT 313 01/30/2024 1146   PLT 386 01/09/2018 1531   MCV 93.9 01/30/2024 1146   MCV 85 01/09/2018 1531   MCH 31.6 01/30/2024 1146   MCHC 33.6 01/30/2024 1146   RDW 13.7 01/30/2024 1146   RDW 13.0 01/09/2018 1531   LYMPHSABS 1.8 01/09/2018 1531   MONOABS 0.4 11/12/2013 1645   EOSABS 81 01/30/2024 1146   EOSABS 0.2 01/09/2018 1531   BASOSABS 30 01/30/2024 1146   BASOSABS 0.0 01/09/2018 1531    Baseline Immunosuppressant Therapy Labs TB GOLD    Latest Ref Rng & Units 09/27/2023   10:27 AM  Quantiferon TB Gold  Quantiferon TB Gold Plus NEGATIVE NEGATIVE    Hepatitis Panel    Latest Ref Rng & Units 09/27/2023   10:27 AM  Hepatitis  Hep B Surface Ag NON-REACTIVE NON-REACTIVE   Hep B IgM NON-REACTIVE NON-REACTIVE     HIV No results found for: HIV Immunoglobulins   SPEP    Latest Ref Rng & Units 01/30/2024    11:46 AM  Serum Protein Electrophoresis  Total Protein 6.1 - 8.1 g/dL 7.0    H3EI No results found for: G6PDH TPMT No results found for: TPMT   Chest x-ray: 03/14/2023 [facility]: IMPRESSION: No acute cardiopulmonary abnormality.   Assessment/Plan:  Reviewed importance of holding HUMIRA with signs/symptoms of an infections, if antibiotics are prescribed to treat an active infection, and with invasive procedures  Demonstrated proper injection technique with Humira demo device  Patient able to demonstrate proper injection technique using the teach back method. Patient self injected in the right lower abdomen with:  Medication: Humira Pen 40mg /mL NDC: 99259-445-97 Lot: [id number] Expiration: 07/2025  Patient tolerated well.  Observed for 30 mins in office for adverse reaction. Patient denies itchiness and irritation at injection., No swelling or redness noted., and Reviewed injection site reaction management with patient verbally and printed information for review in AVS  Patient is to return in 1 month for labs and 6-8 weeks for follow-up appointment.  Standing orders for CBC/CMP.  TB gold will be monitored yearly. Lipid panel will be monitored every 12 months. Referral to [facility] placed today for yearly skin checks while on TNF inhibitor due to risk for non melanoma skin cancer  HUMIRA  approved through insurance .   Rx sent to: [facility]: [phone] .  Patient provided with pharmacy phone number and advised that pharmacy will call to schedule shipment to home.  Patient will continue HUMIRA  40 mg subcut every 14 days as monotherapy.  All questions encouraged and answered.  Instructed patient to call with any further questions or concerns.  Jenkins Graces, PharmD PGY1 Pharmacy Resident [phone]  Sherry Pennant, PharmD, MPH, BCPS, CPP Clinical Pharmacist [facility])  02/19/2024 12:01 PM

## 2024-02-19 NOTE — Patient Instructions (Signed)
 Your next HUMIRA  dose is due on 03/04/24, 03/18/24, and every 14 days thereafter  HOLD HUMIRA  if you have signs or symptoms of an infection. You can resume once you feel better or back to your baseline. HOLD HUMIRA  if you start antibiotics to treat an infection. HOLD HUMIRA  around the time of surgery/procedures. Your surgeon will be able to provide recommendations on when to hold BEFORE and when you are cleared to RESUME.  Pharmacy information: Your prescription will be shipped from [facility]. Their phone number is [phone] Please call to schedule shipment and confirm address. They will mail your medication to your home.  Labs are due in 1 month then every 3 months. Lab hours are from Monday to Thursday 8am-12:30pm and 1pm-4pm and Friday 8am-12pm. You do not need an appointment if you come for labs during these times. If you'd like to go to a [facility] or [facility] closer to home, please call our clinic 48 hours prior to lab date so we can release orders in a timely manner.  Stay up to date on all routine vaccines: influenza, pneumonia, COVID19, Shingles  How to manage an injection site reaction: Remember the 5 C's: COUNTER - leave on the counter at least 30 minutes but up to overnight to bring medication to room temperature. This may help prevent stinging COLD - place something cold (like an ice gel pack or cold water bottle) on the injection site just before cleansing with alcohol. This may help reduce pain CLARITIN - use Claritin (generic name is loratadine) for the first two weeks of treatment or the day of, the day before, and the day after injecting. This will help to minimize injection site reactions CORTISONE CREAM - apply if injection site is irritated and itching CALL ME - if injection site reaction is bigger than the size of your fist, looks infected, blisters, or if you develop hives

## 2024-02-19 NOTE — Progress Notes (Signed)
 I discussed / reviewed the pharmacy note by Jenkins Graces, PharmD and I agree with the findings and plans as documented. All appropriate orders and prescriptions have been placed.  Rice, Lonni ORN, MD  Sherry Pennant, PharmD, MPH, BCPS, CPP Clinical Pharmacist [facility])

## 2024-02-21 NOTE — Progress Notes (Signed)
 Patient started Humira  in office on 02/19/2024 (please see visit note)  Reviewed importance of holding HUMIRA  with signs/symptoms of an infections, if antibiotics are prescribed to treat an active infection, and with invasive procedures.  Patient will continue HUMIRA  40 mg subcut every 14 days as monotherapy.  Sherry Pennant, PharmD, MPH, BCPS, CPP Clinical Pharmacist [facility])

## 2024-03-04 ENCOUNTER — Ambulatory Visit: Admitting: Podiatry

## 2024-03-09 ENCOUNTER — Other Ambulatory Visit (HOSPITAL_COMMUNITY): Payer: Self-pay

## 2024-03-09 ENCOUNTER — Other Ambulatory Visit: Payer: Self-pay

## 2024-03-11 ENCOUNTER — Other Ambulatory Visit: Payer: Self-pay

## 2024-03-13 ENCOUNTER — Other Ambulatory Visit (HOSPITAL_COMMUNITY): Payer: Self-pay

## 2024-03-18 ENCOUNTER — Other Ambulatory Visit: Payer: Self-pay

## 2024-03-18 NOTE — Progress Notes (Signed)
 Specialty Pharmacy Refill Coordination Note  Melanie Henry is a 63 y.o. female contacted today regarding refills of specialty medication(s) Adalimumab  (Humira  (2 Pen))   Patient requested Delivery   Delivery date: 03/24/24   Verified address: [address]  [place] [address]   Medication will be filled on: 03/23/24  Patient provided copay cc for $4.00 ar balance and future refills. Sent info to onsite tech.

## 2024-04-13 ENCOUNTER — Other Ambulatory Visit: Payer: Self-pay

## 2024-04-14 ENCOUNTER — Encounter (INDEPENDENT_AMBULATORY_CARE_PROVIDER_SITE_OTHER): Payer: Self-pay

## 2024-04-15 ENCOUNTER — Other Ambulatory Visit: Payer: Self-pay | Admitting: Pharmacy Technician

## 2024-04-15 ENCOUNTER — Other Ambulatory Visit: Payer: Self-pay

## 2024-04-15 NOTE — Progress Notes (Signed)
 Specialty Pharmacy Refill Coordination Note  Melanie Henry is a 64 y.o. female contacted today regarding refills of specialty medication(s) Adalimumab  (Humira  (2 Pen))   Patient requested Delivery   Delivery date: 05/06/24   Verified address: [address] [place] [address]   Medication will be filled on: 05/05/24

## 2024-04-22 NOTE — Assessment & Plan Note (Signed)
 SABRA

## 2024-04-22 NOTE — Progress Notes (Unsigned)
 "  Office Visit Note  Patient: Melanie Henry             Date of Birth: 1960/10/11           MRN: [id number]             PCP: Sebastian Jerilyn HERO, FNP Referring: Sebastian Jerilyn HERO, FNP Visit Date: 05/04/2024   Subjective:  No chief complaint on file.   History of Present Illness: Melanie Henry is a 64 y.o. female here for follow up with juvenile idiopathic arthritis and hidradenitis suppurativa who presents with persistent joint pain and skin rashes.   Previous HPI 01/30/2024 Melanie Henry is a 64 year old female with juvenile idiopathic arthritis and hidradenitis suppurativa who presents with persistent joint pain and skin rashes.  We previously tried starting methotrexate  which she tolerated very poorly with severe GI upset and diarrhea so is off of the medication.  She was not able to take it long enough to see whether there was any improvement or not for her arthritis or rashes.   She experiences persistent joint pain and stiffness, which have remained stable since her last visit. Her history of juvenile idiopathic arthritis is notable, and she lacks antibody markers for rheumatoid arthritis. Despite consistent joint symptoms, there have been no significant changes in her condition. She suspects that inflammation contributes to her pain and stiffness.   She deals with ongoing skin rashes related to hidradenitis suppurativa. Her primary care physician mentioned Cosentyx, a biologic approved for hidradenitis suppurativa, but she has not started it yet.   Her history includes high inflammation markers from previous blood tests. She has a family history of tuberculosis, as her mother had it when she was a baby. However, her QuantiFERON TB Gold test in June was negative, despite positive skin tests in the past.   She is experienced with self-administered injections, having previously been on daily insulin  and currently using Ozempic once a week.   Previous HPI 10/29/2023 Melanie Henry is a 64  year old female who presents with inflammatory arthritis and hidradenitis suppurativa. Labs at our initial visit demonstrated very high inflammatory markers especially CRP but negative for RA antibodies.    She experiences significant joint pain, particularly in her lower back, which was exacerbated last week after performing leg and thigh exercises. The pain is described as muscular and located in the lower back, which has since eased. She attributes the onset of pain to possibly performing the exercises incorrectly or on an unsuitable surface.   She experiences symptoms of hidradenitis suppurativa, with drainage occurring under her breast and in the groin area. She notes that the drainage has decreased recently. No current oral antibiotic use is reported.   Symptoms of carpal tunnel syndrome include burning and numbness in her hands upon waking and reaching for objects. She uses a brace at night, which has slowed the progression of symptoms. The symptoms are less severe upon waking but develop as she starts using her hands.   She reports feeling extremely fatigued, sleeping 10-12 hours a day, yet still feeling exhausted with zero energy. She describes it as 'almost like there's a button somewhere' that cuts off her energy.     Previous HPI 09/27/23 Melanie Henry is a 64 year old female with hidradenitis suppurativa here for evaluation with joint pain in multiple areas and elevated serum inflammatory markers.   She has experienced increased stiffness and swelling in her hands over the past two years, significantly impacting her daily activities.  She has difficulty gripping objects, such as a frying pan, and her hands sometimes become weak and do not close fully. Numbness and a burning sensation in her knuckles occur, particularly after activities like crocheting, which she can only do for about ten minutes before her hand goes numb.   Her knees and ankles have also been more symptomatic over the last  year, with the right ankle recently becoming stiff and painful. While her knees do not swell significantly, they do experience crunching and popping sounds. She has a history of juvenile rheumatoid arthritis, diagnosed at age 18, and was previously treated with daily aspirin, which led to overdose levels and hallucinations. Currently, she takes Tylenol, two tablets twice or sometimes three times a day, to manage her symptoms.   She has been dealing with hidradenitis suppurativa since she was 46, although she only learned the name of the condition about 8 to 10 years ago. She has not seen a dermatologist but has consulted a surgeon regarding potential surgery for severe spots. She has been treated with antibiotics, typically clindamycin , for infections that occur periodically, with one area draining steadily for about six to seven years.   She also reports issues with her shoulder, which sometimes gets stuck and was previously treated with physical therapy. She describes a crackling sensation when rotating the shoulder. During the review of symptoms, she mentions experiencing tingling and numbness in her hands, particularly when flexing or engaging her hands for extended periods.       Labs reviewed 01/2023 ANA 1:80 homogenous ESR 58 CRP 30.7   12/2017 HCV neg   No Rheumatology ROS completed.   PMFS History:  Patient Active Problem List   Diagnosis Date Noted   Seronegative rheumatoid arthritis (HCC) 10/29/2023   Carpal tunnel syndrome, right upper limb 09/27/2023   High risk medication use 09/27/2023   Fatigue 01/12/2018   Pain due to dental caries 07/22/2015   Hypertension 06/29/2014   Shoulder pain, left 02/18/2014   Depression 02/18/2014   HLD (hyperlipidemia) 11/19/2013   Other malaise and fatigue 11/12/2013   Hidradenitis suppurativa 12/18/2012   Health care maintenance 07/08/2012   Abnormality of gait 07/08/2012   Poorly controlled type 2 diabetes mellitus with peripheral  neuropathy (HCC) 07/03/2012   Morbid obesity (HCC) 07/03/2012   Tobacco abuse 07/03/2012    Past Medical History:  Diagnosis Date   Allergy    Daily headache    for the past 2-3 wks (07/02/2012)   Depression    Dysrhythmia    Eclampsia 04/02/1986   stroke like symtoms with pregnancy, h/o protenuria with eclampsia    Heart murmur 04/02/1986   I had eclampsia; they said I had a stroke (07/02/2012)   Hidradenitis suppurativa    History of juvenile arthritis    diagnosed age 62 y.o; affects hands, knees    Hypertension    Hypothyroid    Kidney stones    once (07/02/2012)   Migraines    occasionally (07/02/2012)   Obesity    Peripheral neuropathy    As of 07/2012 patient reports she was dx'ed 25 yrs ago previously taking Neurontin and B12 shot    Pneumonia ~ 1991   once (07/02/2012)   Tachycardia     Family History  Problem Relation Age of Onset   Cancer Mother        breast cancer x4   Osteoarthritis Mother    Heart failure Mother    Diabetes Father    Liver disease Father  AAA (abdominal aortic aneurysm) Father    Healthy Sister    Glaucoma Maternal Grandmother    Diabetes Paternal Uncle    Past Surgical History:  Procedure Laterality Date   ANKLE FUSION Right 12/02/1978   pinned (07/02/2012)   CESAREAN SECTION  1988; 1990   DENTAL SURGERY  2004   THUMB FUSION Right 12/01/1968   pinned; reattached tendon (07/02/2012)   TONSILLECTOMY  04/02/1978   TOOTH EXTRACTION  2021   x2   TUBAL LIGATION  03/1989; 06/1990   x2   Social History   Social History Narrative   2 kids   Smokes 4-5 cigarettes daily.  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   Objective: Vital Signs: There were no vitals taken for this visit.   Physical Exam   Musculoskeletal Exam: ***   Investigation: No additional findings.  Imaging: No results found.  Recent Labs: Lab  Results  Component Value Date   WBC 3.7 (L) 01/30/2024   HGB 15.1 01/30/2024   PLT 313 01/30/2024   NA 139 01/30/2024   K 4.1 01/30/2024   CL 103 01/30/2024   CO2 29 01/30/2024   GLUCOSE 97 01/30/2024   BUN 8 01/30/2024   CREATININE 0.87 01/30/2024   BILITOT 0.4 01/30/2024   ALKPHOS 101 07/02/2012   AST 11 01/30/2024   ALT 8 01/30/2024   PROT 7.0 01/30/2024   ALBUMIN 3.3 (L) 07/02/2012   CALCIUM  9.2 01/30/2024   GFRAA >89 11/12/2013   QFTBGOLDPLUS NEGATIVE 09/27/2023    Speciality Comments: No specialty comments available.  Procedures:  No procedures performed Allergies: Tetracyclines & related, Aspirin, Metformin  and related, Penicillins, and Methotrexate  and trimetrexate   Assessment / Plan:     Visit Diagnoses:  Assessment & Plan Seronegative rheumatoid arthritis (HCC)     Hidradenitis suppurativa     High risk medication use      ***  Follow-Up Instructions: No follow-ups on file.   Audi Conover M [place] Pelto, CMA  Note - This record has been created using Animal nutritionist.  Chart creation errors have been sought, but may not always  have been located. Such creation errors do not reflect on  the standard of medical care. "

## 2024-05-04 ENCOUNTER — Ambulatory Visit: Admitting: Internal Medicine

## 2024-05-04 DIAGNOSIS — M0600 Rheumatoid arthritis without rheumatoid factor, unspecified site: Secondary | ICD-10-CM

## 2024-05-04 DIAGNOSIS — Z79899 Other long term (current) drug therapy: Secondary | ICD-10-CM

## 2024-05-04 DIAGNOSIS — L732 Hidradenitis suppurativa: Secondary | ICD-10-CM

## 2024-05-05 ENCOUNTER — Other Ambulatory Visit: Payer: Self-pay

## 2024-10-14 ENCOUNTER — Ambulatory Visit: Admitting: Physician Assistant
